# Patient Record
Sex: Male | Born: 1965 | Race: White | Hispanic: No | Marital: Married | Smoking: Former smoker
Health system: Southern US, Community
[De-identification: ages and names within clinical notes are randomized; demographics above are authoritative.]

## PROBLEM LIST (undated history)

## (undated) DIAGNOSIS — E785 Hyperlipidemia, unspecified: Secondary | ICD-10-CM

## (undated) DIAGNOSIS — M51369 Other intervertebral disc degeneration, lumbar region without mention of lumbar back pain or lower extremity pain: Secondary | ICD-10-CM

## (undated) DIAGNOSIS — I10 Essential (primary) hypertension: Secondary | ICD-10-CM

## (undated) DIAGNOSIS — N390 Urinary tract infection, site not specified: Secondary | ICD-10-CM

## (undated) DIAGNOSIS — C801 Malignant (primary) neoplasm, unspecified: Secondary | ICD-10-CM

## (undated) DIAGNOSIS — G4730 Sleep apnea, unspecified: Secondary | ICD-10-CM

## (undated) DIAGNOSIS — E669 Obesity, unspecified: Secondary | ICD-10-CM

## (undated) DIAGNOSIS — M5136 Other intervertebral disc degeneration, lumbar region: Secondary | ICD-10-CM

## (undated) HISTORY — PX: NOSE SURGERY: SHX723

## (undated) HISTORY — DX: Sleep apnea, unspecified: G47.30

## (undated) HISTORY — PX: JOINT REPLACEMENT: SHX530

## (undated) HISTORY — PX: KNEE ARTHROSCOPY W/ MENISCAL REPAIR: SHX1877

## (undated) HISTORY — PX: WISDOM TOOTH EXTRACTION: SHX21

## (undated) HISTORY — DX: Malignant (primary) neoplasm, unspecified: C80.1

## (undated) HISTORY — PX: TONSILLECTOMY: SUR1361

## (undated) HISTORY — PX: FRACTURE SURGERY: SHX138

---

## 2005-01-04 ENCOUNTER — Emergency Department (HOSPITAL_COMMUNITY): Admission: EM | Admit: 2005-01-04 | Discharge: 2005-01-04 | Payer: Self-pay | Admitting: Family Medicine

## 2005-01-24 ENCOUNTER — Emergency Department (HOSPITAL_COMMUNITY): Admission: EM | Admit: 2005-01-24 | Discharge: 2005-01-24 | Payer: Self-pay | Admitting: Emergency Medicine

## 2005-07-06 ENCOUNTER — Emergency Department (HOSPITAL_COMMUNITY): Admission: EM | Admit: 2005-07-06 | Discharge: 2005-07-06 | Payer: Self-pay | Admitting: Family Medicine

## 2008-10-18 ENCOUNTER — Emergency Department (HOSPITAL_COMMUNITY): Admission: EM | Admit: 2008-10-18 | Discharge: 2008-10-18 | Payer: Self-pay | Admitting: Family Medicine

## 2009-05-27 ENCOUNTER — Emergency Department (HOSPITAL_COMMUNITY)
Admission: EM | Admit: 2009-05-27 | Discharge: 2009-05-27 | Payer: Self-pay | Source: Home / Self Care | Admitting: Family Medicine

## 2010-01-11 ENCOUNTER — Emergency Department (HOSPITAL_COMMUNITY)
Admission: EM | Admit: 2010-01-11 | Discharge: 2010-01-11 | Payer: Self-pay | Source: Home / Self Care | Admitting: Emergency Medicine

## 2010-01-21 LAB — POCT I-STAT, CHEM 8
BUN: 16 mg/dL (ref 6–23)
Calcium, Ion: 1.11 mmol/L — ABNORMAL LOW (ref 1.12–1.32)
Chloride: 107 mEq/L (ref 96–112)
Creatinine, Ser: 0.9 mg/dL (ref 0.4–1.5)
Glucose, Bld: 101 mg/dL — ABNORMAL HIGH (ref 70–99)
HCT: 47 % (ref 39.0–52.0)
Hemoglobin: 16 g/dL (ref 13.0–17.0)
Potassium: 3.5 mEq/L (ref 3.5–5.1)
Sodium: 140 mEq/L (ref 135–145)
TCO2: 27 mmol/L (ref 0–100)

## 2010-02-20 ENCOUNTER — Emergency Department (HOSPITAL_COMMUNITY): Payer: Self-pay

## 2010-02-20 ENCOUNTER — Emergency Department (HOSPITAL_COMMUNITY)
Admission: EM | Admit: 2010-02-20 | Discharge: 2010-02-20 | Disposition: A | Discharge status: Home / Self Care | Payer: Self-pay | Attending: Emergency Medicine | Admitting: Emergency Medicine

## 2010-02-20 DIAGNOSIS — I10 Essential (primary) hypertension: Secondary | ICD-10-CM | POA: Insufficient documentation

## 2010-02-20 DIAGNOSIS — M79609 Pain in unspecified limb: Secondary | ICD-10-CM | POA: Insufficient documentation

## 2010-02-20 DIAGNOSIS — IMO0002 Reserved for concepts with insufficient information to code with codable children: Secondary | ICD-10-CM | POA: Insufficient documentation

## 2010-02-20 DIAGNOSIS — W11XXXA Fall on and from ladder, initial encounter: Secondary | ICD-10-CM | POA: Insufficient documentation

## 2011-04-20 ENCOUNTER — Encounter (HOSPITAL_COMMUNITY): Payer: Self-pay | Admitting: Certified Registered Nurse Anesthetist

## 2011-04-20 ENCOUNTER — Emergency Department (HOSPITAL_COMMUNITY): Payer: PRIVATE HEALTH INSURANCE

## 2011-04-20 ENCOUNTER — Inpatient Hospital Stay (HOSPITAL_COMMUNITY)
Admission: EM | Admit: 2011-04-20 | Discharge: 2011-04-25 | DRG: 339 | Disposition: A | Discharge status: Home / Self Care | Payer: PRIVATE HEALTH INSURANCE | Source: Ambulatory Visit | Attending: General Surgery | Admitting: General Surgery

## 2011-04-20 ENCOUNTER — Encounter (HOSPITAL_COMMUNITY): Payer: Self-pay

## 2011-04-20 ENCOUNTER — Emergency Department (HOSPITAL_COMMUNITY): Payer: PRIVATE HEALTH INSURANCE | Admitting: Certified Registered Nurse Anesthetist

## 2011-04-20 ENCOUNTER — Encounter (HOSPITAL_COMMUNITY): Admission: EM | Disposition: A | Discharge status: Home / Self Care | Payer: Self-pay | Source: Ambulatory Visit

## 2011-04-20 DIAGNOSIS — F17200 Nicotine dependence, unspecified, uncomplicated: Secondary | ICD-10-CM | POA: Diagnosis present

## 2011-04-20 DIAGNOSIS — Z6835 Body mass index (BMI) 35.0-35.9, adult: Secondary | ICD-10-CM

## 2011-04-20 DIAGNOSIS — I10 Essential (primary) hypertension: Secondary | ICD-10-CM | POA: Diagnosis present

## 2011-04-20 DIAGNOSIS — Z7982 Long term (current) use of aspirin: Secondary | ICD-10-CM

## 2011-04-20 DIAGNOSIS — E669 Obesity, unspecified: Secondary | ICD-10-CM | POA: Diagnosis present

## 2011-04-20 DIAGNOSIS — K35209 Acute appendicitis with generalized peritonitis, without abscess, unspecified as to perforation: Principal | ICD-10-CM | POA: Diagnosis present

## 2011-04-20 DIAGNOSIS — K3532 Acute appendicitis with perforation and localized peritonitis, without abscess: Secondary | ICD-10-CM

## 2011-04-20 DIAGNOSIS — K560 Paralytic ileus: Secondary | ICD-10-CM | POA: Diagnosis not present

## 2011-04-20 DIAGNOSIS — K352 Acute appendicitis with generalized peritonitis, without abscess: Principal | ICD-10-CM | POA: Diagnosis present

## 2011-04-20 DIAGNOSIS — K3580 Unspecified acute appendicitis: Secondary | ICD-10-CM

## 2011-04-20 DIAGNOSIS — Z5331 Laparoscopic surgical procedure converted to open procedure: Secondary | ICD-10-CM

## 2011-04-20 HISTORY — DX: Essential (primary) hypertension: I10

## 2011-04-20 HISTORY — PX: APPENDECTOMY: SHX54

## 2011-04-20 LAB — CBC
HCT: 42.5 % (ref 39.0–52.0)
Hemoglobin: 15 g/dL (ref 13.0–17.0)
MCH: 30.3 pg (ref 26.0–34.0)
MCHC: 35.3 g/dL (ref 30.0–36.0)
MCV: 85.9 fL (ref 78.0–100.0)
Platelets: 243 10*3/uL (ref 150–400)
RBC: 4.95 MIL/uL (ref 4.22–5.81)
RDW: 12.9 % (ref 11.5–15.5)
WBC: 15.7 10*3/uL — ABNORMAL HIGH (ref 4.0–10.5)

## 2011-04-20 LAB — DIFFERENTIAL
Basophils Absolute: 0 10*3/uL (ref 0.0–0.1)
Basophils Relative: 0 % (ref 0–1)
Eosinophils Absolute: 0 10*3/uL (ref 0.0–0.7)
Eosinophils Relative: 0 % (ref 0–5)
Lymphocytes Relative: 4 % — ABNORMAL LOW (ref 12–46)
Lymphs Abs: 0.6 10*3/uL — ABNORMAL LOW (ref 0.7–4.0)
Monocytes Absolute: 1.5 10*3/uL — ABNORMAL HIGH (ref 0.1–1.0)
Monocytes Relative: 9 % (ref 3–12)
Neutro Abs: 13.6 10*3/uL — ABNORMAL HIGH (ref 1.7–7.7)
Neutrophils Relative %: 86 % — ABNORMAL HIGH (ref 43–77)

## 2011-04-20 LAB — COMPREHENSIVE METABOLIC PANEL
ALT: 32 U/L (ref 0–53)
AST: 28 U/L (ref 0–37)
Albumin: 3.9 g/dL (ref 3.5–5.2)
Alkaline Phosphatase: 67 U/L (ref 39–117)
BUN: 14 mg/dL (ref 6–23)
CO2: 27 mEq/L (ref 19–32)
Calcium: 9.7 mg/dL (ref 8.4–10.5)
Chloride: 103 mEq/L (ref 96–112)
Creatinine, Ser: 0.88 mg/dL (ref 0.50–1.35)
GFR calc Af Amer: >90 mL/min (ref >90)
GFR calc non Af Amer: >90 mL/min (ref >90)
Glucose, Bld: 111 mg/dL — ABNORMAL HIGH (ref 70–99)
Potassium: 4 mEq/L (ref 3.5–5.1)
Sodium: 141 mEq/L (ref 135–145)
Total Bilirubin: 1 mg/dL (ref 0.3–1.2)
Total Protein: 7.3 g/dL (ref 6.0–8.3)

## 2011-04-20 LAB — URINALYSIS, ROUTINE W REFLEX MICROSCOPIC
Bilirubin Urine: NEGATIVE
Glucose, UA: NEGATIVE mg/dL
Ketones, ur: >80 mg/dL — AB
Nitrite: NEGATIVE
Protein, ur: NEGATIVE mg/dL
Specific Gravity, Urine: 1.027 (ref 1.005–1.030)
Urobilinogen, UA: 1 mg/dL (ref 0.0–1.0)
pH: 6 (ref 5.0–8.0)

## 2011-04-20 LAB — URINE MICROSCOPIC-ADD ON

## 2011-04-20 LAB — LIPASE, BLOOD: Lipase: 22 U/L (ref 11–59)

## 2011-04-20 SURGERY — APPENDECTOMY, LAPAROSCOPIC
Anesthesia: General | Site: Abdomen | Wound class: Dirty or Infected

## 2011-04-20 MED ORDER — IOHEXOL 300 MG/ML  SOLN
20.0000 mL | INTRAMUSCULAR | Status: AC
  Administered 2011-04-20 (×2): 20 mL via ORAL

## 2011-04-20 MED ORDER — SODIUM CHLORIDE 0.9 % IV SOLN
1.0000 g | Freq: Once | INTRAVENOUS | Status: AC
  Administered 2011-04-20: 1 g via INTRAVENOUS
  Filled 2011-04-20: qty 1

## 2011-04-20 MED ORDER — PROPOFOL 10 MG/ML IV EMUL
INTRAVENOUS | Status: DC | PRN
  Administered 2011-04-20: 300 mg via INTRAVENOUS

## 2011-04-20 MED ORDER — IOHEXOL 300 MG/ML  SOLN
100.0000 mL | Freq: Once | INTRAMUSCULAR | Status: AC | PRN
  Administered 2011-04-20: 100 mL via INTRAVENOUS

## 2011-04-20 MED ORDER — ONDANSETRON HCL 4 MG/2ML IJ SOLN: 4.0000 mg | Freq: Four times a day (QID) | INTRAMUSCULAR | Status: DC | PRN

## 2011-04-20 MED ORDER — MIDAZOLAM HCL 5 MG/5ML IJ SOLN
INTRAMUSCULAR | Status: DC | PRN
  Administered 2011-04-20: 2 mg via INTRAVENOUS

## 2011-04-20 MED ORDER — SODIUM CHLORIDE 0.9 % IV SOLN
INTRAVENOUS | Status: DC
  Administered 2011-04-21 – 2011-04-23 (×6): via INTRAVENOUS

## 2011-04-20 MED ORDER — METOPROLOL TARTRATE 1 MG/ML IV SOLN
INTRAVENOUS | Status: DC | PRN
  Administered 2011-04-20 (×5): 1 mg via INTRAVENOUS

## 2011-04-20 MED ORDER — PIPERACILLIN-TAZOBACTAM 3.375 G IVPB: 3.3750 g | Freq: Once | INTRAVENOUS | Status: DC

## 2011-04-20 MED ORDER — HYDROMORPHONE 0.3 MG/ML IV SOLN
INTRAVENOUS | Status: DC
  Administered 2011-04-20: 22:00:00 via INTRAVENOUS
  Administered 2011-04-21: 3 mg via INTRAVENOUS
  Administered 2011-04-21: 16:00:00 via INTRAVENOUS
  Administered 2011-04-21: 4.08 mg via INTRAVENOUS
  Administered 2011-04-21: 4.8 mg via INTRAVENOUS
  Administered 2011-04-21: 2.4 mg via INTRAVENOUS
  Administered 2011-04-21: 2.7 mg via INTRAVENOUS
  Administered 2011-04-21: 07:00:00 via INTRAVENOUS
  Administered 2011-04-21: 2.1 mg via INTRAVENOUS
  Administered 2011-04-22: 5.1 mg via INTRAVENOUS
  Administered 2011-04-22: 0.6 mg via INTRAVENOUS
  Administered 2011-04-22: 1.5 mg via INTRAVENOUS
  Administered 2011-04-22: 4.8 mg via INTRAVENOUS
  Administered 2011-04-22: 1.8 mg via INTRAVENOUS
  Administered 2011-04-22: 1.5 mg via INTRAVENOUS
  Administered 2011-04-23: 0.9 mg via INTRAVENOUS
  Administered 2011-04-23: 1.5 mg via INTRAVENOUS
  Administered 2011-04-23: 0.6 mg via INTRAVENOUS
  Administered 2011-04-23: 10:00:00 via INTRAVENOUS
  Administered 2011-04-23: 0.9 mg via INTRAVENOUS
  Administered 2011-04-23: 5 mL via INTRAVENOUS
  Administered 2011-04-23: 8 mL via INTRAVENOUS
  Administered 2011-04-24: 0.3 mg via INTRAVENOUS
  Administered 2011-04-24: 0.9 mg via INTRAVENOUS

## 2011-04-20 MED ORDER — GLYCOPYRROLATE 0.2 MG/ML IJ SOLN
INTRAMUSCULAR | Status: DC | PRN
  Administered 2011-04-20: .6 mg via INTRAVENOUS

## 2011-04-20 MED ORDER — SUCCINYLCHOLINE CHLORIDE 20 MG/ML IJ SOLN
INTRAMUSCULAR | Status: DC | PRN
  Administered 2011-04-20: 200 mg via INTRAVENOUS

## 2011-04-20 MED ORDER — HYDROMORPHONE 0.3 MG/ML IV SOLN
INTRAVENOUS | Status: AC
  Filled 2011-04-20: qty 25

## 2011-04-20 MED ORDER — HYDROMORPHONE HCL PF 1 MG/ML IJ SOLN
0.2500 mg | INTRAMUSCULAR | Status: DC | PRN
  Administered 2011-04-20 (×4): 0.5 mg via INTRAVENOUS

## 2011-04-20 MED ORDER — DEXAMETHASONE SODIUM PHOSPHATE 4 MG/ML IJ SOLN
INTRAMUSCULAR | Status: DC | PRN
  Administered 2011-04-20: 8 mg via INTRAVENOUS

## 2011-04-20 MED ORDER — MORPHINE SULFATE 4 MG/ML IJ SOLN
4.0000 mg | Freq: Once | INTRAMUSCULAR | Status: AC
  Administered 2011-04-20: 4 mg via INTRAVENOUS
  Filled 2011-04-20: qty 1

## 2011-04-20 MED ORDER — LIDOCAINE HCL (CARDIAC) 20 MG/ML IV SOLN
INTRAVENOUS | Status: DC | PRN
  Administered 2011-04-20: 100 mg via INTRAVENOUS

## 2011-04-20 MED ORDER — NEOSTIGMINE METHYLSULFATE 1 MG/ML IJ SOLN
INTRAMUSCULAR | Status: DC | PRN
  Administered 2011-04-20: 5 mg via INTRAVENOUS

## 2011-04-20 MED ORDER — ACETAMINOPHEN 325 MG PO TABS: 650.0000 mg | ORAL_TABLET | Freq: Four times a day (QID) | ORAL | Status: DC | PRN

## 2011-04-20 MED ORDER — ONDANSETRON HCL 4 MG/2ML IJ SOLN
4.0000 mg | Freq: Four times a day (QID) | INTRAMUSCULAR | Status: DC | PRN
  Filled 2011-04-20: qty 2

## 2011-04-20 MED ORDER — ACETAMINOPHEN 650 MG RE SUPP: 650.0000 mg | Freq: Four times a day (QID) | RECTAL | Status: DC | PRN

## 2011-04-20 MED ORDER — PANTOPRAZOLE SODIUM 40 MG IV SOLR
40.0000 mg | Freq: Every day | INTRAVENOUS | Status: DC
  Administered 2011-04-21 – 2011-04-23 (×3): 40 mg via INTRAVENOUS
  Filled 2011-04-20 (×4): qty 40

## 2011-04-20 MED ORDER — SODIUM CHLORIDE 0.9 % IR SOLN
Status: DC | PRN
  Administered 2011-04-20: 3000 mL
  Administered 2011-04-20: 1000 mL

## 2011-04-20 MED ORDER — SODIUM CHLORIDE 0.9 % IV SOLN
INTRAVENOUS | Status: DC
  Administered 2011-04-20 (×2): 125 mL/h via INTRAVENOUS

## 2011-04-20 MED ORDER — LACTATED RINGERS IV SOLN
INTRAVENOUS | Status: DC | PRN
  Administered 2011-04-20 (×2): via INTRAVENOUS

## 2011-04-20 MED ORDER — NEBIVOLOL HCL 10 MG PO TABS
10.0000 mg | ORAL_TABLET | Freq: Every day | ORAL | Status: DC
  Filled 2011-04-20: qty 1

## 2011-04-20 MED ORDER — ONDANSETRON HCL 4 MG/2ML IJ SOLN
INTRAMUSCULAR | Status: DC | PRN
  Administered 2011-04-20: 4 mg via INTRAVENOUS

## 2011-04-20 MED ORDER — DIPHENHYDRAMINE HCL 50 MG/ML IJ SOLN: 12.5000 mg | Freq: Four times a day (QID) | INTRAMUSCULAR | Status: DC | PRN

## 2011-04-20 MED ORDER — ONDANSETRON HCL 4 MG/2ML IJ SOLN
4.0000 mg | Freq: Once | INTRAMUSCULAR | Status: AC
  Administered 2011-04-20: 4 mg via INTRAVENOUS
  Filled 2011-04-20: qty 2

## 2011-04-20 MED ORDER — DIPHENHYDRAMINE HCL 12.5 MG/5ML PO ELIX
12.5000 mg | ORAL_SOLUTION | Freq: Four times a day (QID) | ORAL | Status: DC | PRN
  Filled 2011-04-20: qty 5

## 2011-04-20 MED ORDER — FENTANYL CITRATE 0.05 MG/ML IJ SOLN
INTRAMUSCULAR | Status: DC | PRN
  Administered 2011-04-20 (×7): 100 ug via INTRAVENOUS

## 2011-04-20 MED ORDER — NALOXONE HCL 0.4 MG/ML IJ SOLN: 0.4000 mg | INTRAMUSCULAR | Status: DC | PRN

## 2011-04-20 MED ORDER — SODIUM CHLORIDE 0.9 % IV SOLN
1.0000 g | INTRAVENOUS | Status: DC
  Administered 2011-04-21 – 2011-04-23 (×3): 1 g via INTRAVENOUS
  Filled 2011-04-20 (×4): qty 1

## 2011-04-20 MED ORDER — ONDANSETRON HCL 4 MG/2ML IJ SOLN: 4.0000 mg | Freq: Once | INTRAMUSCULAR | Status: DC | PRN

## 2011-04-20 MED ORDER — SODIUM CHLORIDE 0.9 % IJ SOLN: 9.0000 mL | INTRAMUSCULAR | Status: DC | PRN

## 2011-04-20 MED ORDER — VECURONIUM BROMIDE 10 MG IV SOLR
INTRAVENOUS | Status: DC | PRN
  Administered 2011-04-20: 4 mg via INTRAVENOUS
  Administered 2011-04-20: 6 mg via INTRAVENOUS
  Administered 2011-04-20: 4 mg via INTRAVENOUS

## 2011-04-20 MED ORDER — DROPERIDOL 2.5 MG/ML IJ SOLN
INTRAMUSCULAR | Status: DC | PRN
  Administered 2011-04-20: 0.625 mg via INTRAVENOUS

## 2011-04-20 MED ORDER — METOCLOPRAMIDE HCL 5 MG/ML IJ SOLN
INTRAMUSCULAR | Status: DC | PRN
  Administered 2011-04-20: 10 mg via INTRAVENOUS

## 2011-04-20 MED ORDER — HYDROMORPHONE HCL PF 1 MG/ML IJ SOLN
INTRAMUSCULAR | Status: AC
  Filled 2011-04-20: qty 1

## 2011-04-20 SURGICAL SUPPLY — 55 items
ADH SKN CLS APL DERMABOND .7 (GAUZE/BANDAGES/DRESSINGS) ×1
APPLIER CLIP ROT 10 11.4 M/L (STAPLE)
APR CLP MED LRG 11.4X10 (STAPLE)
BAG SPEC RTRVL LRG 6X4 10 (ENDOMECHANICALS) ×1
CANISTER SUCTION 2500CC (MISCELLANEOUS) IMPLANT
CANISTER WOUND CARE 500ML ATS (WOUND CARE) ×1 IMPLANT
CHLORAPREP W/TINT 26ML (MISCELLANEOUS) ×2 IMPLANT
CLIP APPLIE ROT 10 11.4 M/L (STAPLE) IMPLANT
CLOTH BEACON ORANGE TIMEOUT ST (SAFETY) ×2 IMPLANT
COVER SURGICAL LIGHT HANDLE (MISCELLANEOUS) ×2 IMPLANT
CUTTER FLEX LINEAR 45M (STAPLE) ×2 IMPLANT
DERMABOND ADVANCED (GAUZE/BANDAGES/DRESSINGS) ×1
DERMABOND ADVANCED .7 DNX12 (GAUZE/BANDAGES/DRESSINGS) ×1 IMPLANT
DRAIN CHANNEL 19F RND (DRAIN) ×1 IMPLANT
DRSG VAC ATS LRG SENSATRAC (GAUZE/BANDAGES/DRESSINGS) ×2 IMPLANT
DURAPREP 26ML APPLICATOR (WOUND CARE) ×1 IMPLANT
ELECT REM PT RETURN 9FT ADLT (ELECTROSURGICAL) ×2
ELECTRODE REM PT RTRN 9FT ADLT (ELECTROSURGICAL) ×1 IMPLANT
EVACUATOR SILICONE 100CC (DRAIN) ×1 IMPLANT
GAUZE SPONGE 2X2 8PLY STRL LF (GAUZE/BANDAGES/DRESSINGS) IMPLANT
GLOVE BIO SURGEON STRL SZ7 (GLOVE) ×2 IMPLANT
GLOVE BIOGEL PI IND STRL 7.5 (GLOVE) ×1 IMPLANT
GLOVE BIOGEL PI INDICATOR 7.5 (GLOVE) ×1
GOWN STRL NON-REIN LRG LVL3 (GOWN DISPOSABLE) ×6 IMPLANT
KIT BASIN OR (CUSTOM PROCEDURE TRAY) ×2 IMPLANT
KIT ROOM TURNOVER OR (KITS) ×2 IMPLANT
NS IRRIG 1000ML POUR BTL (IV SOLUTION) ×2 IMPLANT
PAD ARMBOARD 7.5X6 YLW CONV (MISCELLANEOUS) ×4 IMPLANT
POUCH SPECIMEN RETRIEVAL 10MM (ENDOMECHANICALS) ×2 IMPLANT
RELOAD 45 VASCULAR/THIN (ENDOMECHANICALS) IMPLANT
RELOAD STAPLE 45 2.5 WHT GRN (ENDOMECHANICALS) ×1 IMPLANT
RELOAD STAPLE 45 3.5 BLU ETS (ENDOMECHANICALS) IMPLANT
RELOAD STAPLE TA45 3.5 REG BLU (ENDOMECHANICALS) ×4 IMPLANT
SCALPEL HARMONIC ACE (MISCELLANEOUS) ×2 IMPLANT
SCISSORS LAP 5X35 DISP (ENDOMECHANICALS) IMPLANT
SET IRRIG TUBING LAPAROSCOPIC (IRRIGATION / IRRIGATOR) ×2 IMPLANT
SLEEVE ENDOPATH XCEL 5M (ENDOMECHANICALS) ×2 IMPLANT
SPECIMEN JAR MEDIUM (MISCELLANEOUS) ×1 IMPLANT
SPONGE GAUZE 2X2 STER 10/PKG (GAUZE/BANDAGES/DRESSINGS) ×1
STAPLER VISISTAT 35W (STAPLE) ×1 IMPLANT
SUCTION POOLE TIP (SUCTIONS) ×1 IMPLANT
SUT ETHILON 2 0 FS 18 (SUTURE) ×1 IMPLANT
SUT MNCRL AB 4-0 PS2 18 (SUTURE) ×2 IMPLANT
SUT NOVA T20/GS 25 (SUTURE) ×1 IMPLANT
SUT PDS AB 1 TP1 96 (SUTURE) ×2 IMPLANT
SUT SILK 2 0 SH CR/8 (SUTURE) ×1 IMPLANT
SUT SILK 3 0 SH CR/8 (SUTURE) ×1 IMPLANT
SYR BULB IRRIGATION 50ML (SYRINGE) ×1 IMPLANT
TOWEL OR 17X24 6PK STRL BLUE (TOWEL DISPOSABLE) ×2 IMPLANT
TOWEL OR 17X26 10 PK STRL BLUE (TOWEL DISPOSABLE) ×2 IMPLANT
TRAY FOLEY CATH 14FR (SET/KITS/TRAYS/PACK) ×2 IMPLANT
TRAY LAPAROSCOPIC (CUSTOM PROCEDURE TRAY) ×2 IMPLANT
TROCAR XCEL BLUNT TIP 100MML (ENDOMECHANICALS) ×2 IMPLANT
TROCAR XCEL NON-BLD 5MMX100MML (ENDOMECHANICALS) ×2 IMPLANT
YANKAUER SUCT BULB TIP NO VENT (SUCTIONS) ×1 IMPLANT

## 2011-04-20 NOTE — ED Provider Notes (Signed)
History     CSN: [id number]  Arrival date & time 04/20/11  0756   First MD Initiated Contact with Patient 04/20/11 [phone]      Chief Complaint  Patient presents with  . Shortness of Breath    (Consider location/radiation/quality/duration/timing/severity/associated sxs/prior treatment) HPI  Past Medical History  Diagnosis Date  . Hypertension     Past Surgical History  Procedure Date  . Tonsillectomy   . Fracture surgery     No family history on file.  History  Substance Use Topics  . Smoking status: Current Some Day Smoker    Types: Cigars  . Smokeless tobacco: Not on file  . Alcohol Use: No      Review of Systems  Allergies  Review of patient's allergies indicates no known allergies.  Home Medications   Current Outpatient Rx  Name Route Sig Dispense Refill  . ASPIRIN 81 MG PO CHEW Oral Chew 243 mg by mouth daily.    . CYCLOBENZAPRINE HCL 10 MG PO TABS Oral Take 10 mg by mouth 2 (two) times daily as needed. For back pain    . HYDROCHLOROTHIAZIDE 25 MG PO TABS Oral Take 25 mg by mouth daily.    . ADULT MULTIVITAMIN W/MINERALS CH Oral Take 1 tablet by mouth daily.    . NEBIVOLOL HCL 10 MG PO TABS Oral Take 10 mg by mouth daily.    . OMEGA 3 1200 MG PO CAPS Oral Take 1 capsule by mouth daily.    . TRAMADOL HCL 50 MG PO TABS Oral Take 50 mg by mouth 2 (two) times daily as needed. For pain      BP 162/57  Pulse 95  Temp(Src) 99.1 F (37.3 C) (Oral)  Resp 20  SpO2 95%  Physical Exam  ED Course  Procedures (including critical care time)  Labs Reviewed  COMPREHENSIVE METABOLIC PANEL - Abnormal; Notable for the following:    Glucose, Bld 111 (*)    All other components within normal limits  CBC - Abnormal; Notable for the following:    WBC 15.7 (*)    All other components within normal limits  DIFFERENTIAL - Abnormal; Notable for the following:    Neutrophils Relative 86 (*)    Neutro Abs 13.6 (*)    Lymphocytes Relative 4 (*)    Lymphs Abs 0.6  (*)    Monocytes Absolute 1.5 (*)    All other components within normal limits  LIPASE, BLOOD  URINALYSIS, ROUTINE W REFLEX MICROSCOPIC   US Abdomen Complete  04/20/2011  *RADIOLOGY REPORT*  Clinical Data:  46 year old male with right-sided abdominal pain.  ABDOMINAL ULTRASOUND COMPLETE  Comparison:  None  Findings:  Gallbladder: At least two small mobile gallstones are noted, the largest measuring 5 mm.  There is no evidence of gallbladder wall thickening, pericholecystic fluid or sonographic Murphy's sign.  Common Bile Duct:  There is no evidence of intrahepatic or extrahepatic biliary dilation. The CBD measures 4.5 mm in greatest diameter.  Liver:  The liver is within normal limits in parenchymal echogenicity. No focal abnormalities are identified.  IVC:  Appears normal.  Pancreas:  Although the pancreas is difficult to visualize in its entirety, no focal pancreatic abnormality is identified.  Spleen:  Within normal limits in size and echotexture.  Right kidney:  The right kidney is normal in size and parenchymal echogenicity.  There is no evidence of solid mass, hydronephrosis or definite renal calculi.  The right kidney measures 12.7 cm.  Left kidney:  The  left kidney is normal in size and parenchymal echogenicity.  There is no evidence of solid mass, hydronephrosis or definite renal calculi.   The left kidney measures 12.2 cm.  Abdominal Aorta:  No abdominal aortic aneurysm identified.  There is no evidence of ascites.  IMPRESSION: Cholelithiasis without evidence of acute cholecystitis.  No other significant abnormalities identified.  Original Report Authenticated By: Rosendo Gros, M.D.     1. Ruptured appendicitis     11:40 AM Patient seen and examined. Patient to CDU pending surgery consult for gallstones, right upper quadrant pain.  Vital signs reviewed and are as follows: Filed Vitals:   04/20/11 1124  BP: 162/57  Pulse: 95  Temp:   Resp: 20   Surgeon has seen patient and has  ordered a CT scan. Patient to be admitted to the hospital if CT scan is negative for cholecystectomy. Additional pain medication ordered.  Exam:  Gen NAD; Heart RRR, nml S1,S2, no m/r/g; Lungs CTAB; Abd soft, RUQ tenderness, no rebound, involuntary guarding; Ext 2+ pedal pulses bilaterally, no edema.  4:40PM CT showed ruptured appendicitis. Reviewed by myself. Dr. Dwain Sarna aware. Invanz ordered. Patient informed. He is comfortable.   BP 100/65  Pulse 93  Temp(Src) 102.6 F (39.2 C) (Oral)  Resp 18  SpO2 95%   MDM  Admit        Renne Crigler, PA 04/20/11 1710

## 2011-04-20 NOTE — ED Notes (Signed)
Patient transported to CT 

## 2011-04-20 NOTE — Progress Notes (Signed)
Wound VAC wound intact and sealed per protocol.

## 2011-04-20 NOTE — Anesthesia Preprocedure Evaluation (Addendum)
Anesthesia Evaluation  Patient identified by MRN, date of birth, ID band Patient awake    Reviewed: Allergy & Precautions, H&P , NPO status , Patient's Chart, lab work & pertinent test results, reviewed documented beta blocker date and time   Airway Mallampati: II TM Distance: >3 FB Neck ROM: Full    Dental  (+) Teeth Intact and Dental Advisory Given   Pulmonary  breath sounds clear to auscultation        Cardiovascular Rhythm:Regular Rate:Normal     Neuro/Psych    GI/Hepatic   Endo/Other  Morbid obesity  Renal/GU      Musculoskeletal   Abdominal   Peds  Hematology   Anesthesia Other Findings   Reproductive/Obstetrics                           Anesthesia Physical Anesthesia Plan  ASA: III and Emergent  Anesthesia Plan: General   Post-op Pain Management:    Induction: Intravenous and Rapid sequence  Airway Management Planned: Oral ETT  Additional Equipment:   Intra-op Plan:   Post-operative Plan: Extubation in OR  Informed Consent: I have reviewed the patients History and Physical, chart, labs and discussed the procedure including the risks, benefits and alternatives for the proposed anesthesia with the patient or authorized representative who has indicated his/her understanding and acceptance.   Dental advisory given  Plan Discussed with: CRNA, Anesthesiologist and Surgeon  Anesthesia Plan Comments:         Anesthesia Quick Evaluation

## 2011-04-20 NOTE — Preoperative (Addendum)
Beta Blockers   Reason not to administer Beta Blockers:pt took Bystolic 15 mg po@0600hr  on 04/19/2011 (RF).Order per Dr. Ivin Booty to adm 5mg  Metoprolol IV intra-op.

## 2011-04-20 NOTE — Anesthesia Postprocedure Evaluation (Signed)
  Anesthesia Post-op Note  Patient: Scott York  Procedure(s) Performed: Procedure(s) (LRB): APPENDECTOMY LAPAROSCOPIC (N/A) APPENDECTOMY (N/A)  Patient Location: PACU  Anesthesia Type: General  Level of Consciousness: awake, alert  and oriented  Airway and Oxygen Therapy: Patient Spontanous Breathing and Patient connected to nasal cannula oxygen  Post-op Pain: mild  Post-op Assessment: Post-op Vital signs reviewed, Patient's Cardiovascular Status Stable, Respiratory Function Stable, Patent Airway, No signs of Nausea or vomiting and Pain level controlled  Post-op Vital Signs: Reviewed and stable  Complications: No apparent anesthesia complications

## 2011-04-20 NOTE — Op Note (Signed)
Preoperative diagnosis: Acute appendicitis Postoperative diagnosis: Acute gangrenous appendicitis with local abscess Procedure: #1 attempted laparoscopic appendectomy #2 celiotomy with open appendectomy, and drain placement #3 vac placement over wound Surgeon: Dr. Harden Mo Anesthesia: Gen. Endotracheal Estimated blood loss: 50 cc Specimens: Appendix and surrounding tissue to pathology Sponge and found correct x2 that operation Disposition to recovery in stable condition  Indications: This is a 46 year old male who had right-sided abdominal pain, elevated white blood cell count.He underwent a right upper quadrant ultrasound that showed cholelithiasis and I was consulted by the emergency room to discuss cholecystectomy. On my exam I was concerned that his ultrasound did not really correlate with this. I obtained a CT scan which showed appendicitis that appeared to have ruptured without a definable abscess. We discussed options and decided proceed to the operating room. The risks and benefits of appendectomy were discussed prior to beginning.  Procedure: After informed consent was obtained the patient was taken to the operating room. He was given 1 g of intravenous Invanz. Sequential compression devices were placed on his legs prior to beginning. He then underwent general endotracheal anesthesia without complication. His abdomen was prepped and draped in the standard sterile surgical fashion. Surgical timeout was performed.  I then infiltrated quarter percent Marcaine in his left upper quadrant. I made an incision in it and inserted a 5 mm Optiview trocar without any difficulty. I inspected this area and there is no evidence of an entry injury. I then inserted 3 further 5 mm trocars in the lower abdomen and one at the umbilicus. His cecum was noted to be in his right upper quadrant as expected from the CT scan. I then pushed the omentum back. There was an abscess cavity right around his cecum.  His terminal ileum was identified and not injured throughout the procedure. I was able to roll his colon medially after I took down the white line of Toldt with the harmonic scalpel. Then I used blunt dissection and harmonic scalpel dissection to attempt to try and identify the appendix and separated it  from the cecum. After about an hour was unable to do this successfully and I was concerned about causing an injury. I was unable laparoscopically to define his appendix and cecum. I decided at that time to stop laparoscopy and proceeded to an open operation. I then did an upper midline due to the position of his appendix. I inserted the Bookwalter retractor. This was difficult opened even identify any of the structures around his cecum. Eventually I got into a pocket and drained murky fluid as well as debridement of necrotic tissue. His appendix for the most part was not really definable in this area. I eventually was able to identify its base. He actually had a complete disruption of his appendix about a centimeter half off of a clean base and the remainder of the appendix was gangrenous and not really identifiable. The remainder of the cecum was soft and pink. I did use a stapler to staple across the appendiceal base as well as a small portion of the cecum. I then oversewed this with 3-0 silk suture. The appendiceal artery was identified and tied off with a 3-0 silk suture. Once I completed this I could not identify any more portions of appendix. It appeared I drained all the infection at this point. I irrigated this copiously. I then inspected the cecum and the terminal ileum again and they both appeared healthy. I then placed a 77 [place] Blake drain  in the right lower quadrant. I had previously removed all my trocars. I then closed the incision with #1 loop PDS. I irrigated the incision and placed a vac sponge overlying it. This was functional upon completion. However this was extubated and transferred recovery  room in stable condition.

## 2011-04-20 NOTE — Anesthesia Procedure Notes (Signed)
Procedure Name: Intubation Date/Time: 04/20/2011 6:08 PM Performed by: Wray Kearns A Pre-anesthesia Checklist: Patient identified, Timeout performed, Emergency Drugs available, Suction available and Patient being monitored Patient Re-evaluated:Patient Re-evaluated prior to inductionOxygen Delivery Method: Circle system utilized Preoxygenation: Pre-oxygenation with 100% oxygen Intubation Type: IV induction, Rapid sequence and Cricoid Pressure applied Ventilation: Mask ventilation without difficulty Laryngoscope Size: Mac and 4 Grade View: Grade I Tube type: Oral Tube size: 8.0 mm Number of attempts: 1 Airway Equipment and Method: Stylet Placement Confirmation: ETT inserted through vocal cords under direct vision,  positive ETCO2,  CO2 detector and breath sounds checked- equal and bilateral Secured at: 23 cm Tube secured with: Tape Dental Injury: Teeth and Oropharynx as per pre-operative assessment

## 2011-04-20 NOTE — Transfer of Care (Signed)
Immediate Anesthesia Transfer of Care Note  Patient: Scott York  Procedure(s) Performed: Procedure(s) (LRB): APPENDECTOMY LAPAROSCOPIC (N/A) APPENDECTOMY (N/A)  Patient Location: PACU  Anesthesia Type: General  Level of Consciousness: oriented, sedated, patient cooperative and responds to stimulation  Airway & Oxygen Therapy: Patient Spontanous Breathing and Patient connected to nasal cannula oxygen  Post-op Assessment: Report given to PACU RN, Post -op Vital signs reviewed and stable, Patient moving all extremities and Patient moving all extremities X 4  Post vital signs: Reviewed and stable  Complications: No apparent anesthesia complications

## 2011-04-20 NOTE — ED Notes (Signed)
Pt still unable to give urine sample

## 2011-04-20 NOTE — ED Provider Notes (Signed)
History     CSN: [id number]  Arrival date & time 04/20/11  0756   First MD Initiated Contact with Patient 04/20/11 [phone]      Chief Complaint  Patient presents with  . Shortness of Breath    (Consider location/radiation/quality/duration/timing/severity/associated sxs/prior treatment) The history is provided by the patient and the spouse.   the patient is a 46 year old morbidly obese male, with a history of hypertension, who presents emergency department complaining of sudden onset of right upper abdominal pain since last night.  He was sitting talking to his wife, when his symptoms began.  He says the pain is so severe that it is hard for him to take a deep breath.  He denies shortness of breath. He denies chest pain.  He denies fevers, chills, cough, leg pain or swelling.  He denies recent travel or surgery.  He does say that he is nauseated.  He has not had vomiting, or diarrhea.  He denies hematuria or dysuria.  He has never had similar symptoms in the past.  He has never had abdominal surgery in the past  Past Medical History  Diagnosis Date  . Hypertension     Past Surgical History  Procedure Date  . Tonsillectomy   . Fracture surgery     No family history on file.  History  Substance Use Topics  . Smoking status: Current Some Day Smoker    Types: Cigars  . Smokeless tobacco: Not on file  . Alcohol Use: No      Review of Systems  Constitutional: Negative for fever and chills.  Respiratory: Negative for shortness of breath.   Cardiovascular: Negative for chest pain.  Gastrointestinal: Positive for nausea and abdominal pain. Negative for vomiting and diarrhea.  Genitourinary: Negative for dysuria and hematuria.  All other systems reviewed and are negative.    Allergies  Review of patient's allergies indicates not on file.  Home Medications  No current outpatient prescriptions on file.  BP 122/55  Pulse 84  Temp(Src) 99.1 F (37.3 C) (Oral)  Resp 20   SpO2 100%  Physical Exam  Vitals reviewed. Constitutional: He is oriented to person, place, and time. No distress.       Morbidly obese.  Holding right upper quadrant with his hand  HENT:  Head: Normocephalic and atraumatic.  Eyes: Conjunctivae are normal.  Neck: Normal range of motion.  Cardiovascular: Normal rate.   No murmur heard. Pulmonary/Chest: Effort normal. No respiratory distress. He has no wheezes. He has no rales.       Splinting because of pain when he takes a deep breath  Abdominal: Soft. There is tenderness. There is no rebound and no guarding.       Right upper quadrant tenderness, with Murphy sign  Musculoskeletal: Normal range of motion.  Neurological: He is alert and oriented to person, place, and time.  Skin: Skin is warm and dry.  Psychiatric: He has a normal mood and affect. Thought content normal.    ED Course  Procedures (including critical care time) Morbidly obese male, with right upper quadrant pain, tenderness and positive Murphy's sign.  He says it hurts to take a deep breath, but is not short of breath.  He has no risk factors for a pulmonary embolism.  We will establish an IV perform laboratory testing, including a cemented lipase, and urinalysis to evaluate for hepatobiliary disease, versus renal disease.  I will give him IV analgesics, and antiemetics, and also order an ultrasound of his abdomen  to look for hepatobiliary disease, which is  The most likely etiology for his symptoms   Labs Reviewed  COMPREHENSIVE METABOLIC PANEL  CBC  DIFFERENTIAL  LIPASE, BLOOD  URINALYSIS, ROUTINE W REFLEX MICROSCOPIC   No results found.   No diagnosis found.  10:13 AM Still has pain and ttp in ruq with murphy's. Will discuss with surg.   10:37 AM Dr. Dwain Sarna is in OR.  Spoke with the or nurse.  She will pass on message that I think pt should be seen for cholecystectomy.  MDM  Abdominal pain Cholelithiasis with leukocytosis        Cheri Guppy, MD 04/20/11 1528

## 2011-04-20 NOTE — ED Notes (Signed)
Pt states last night around 11pm pt was sitting and then he turned towards the kitchen and felt a sharp pain on the right side at the bottom of the rib cage.  Pt now c/o SOB and continues to have pain on the right side.

## 2011-04-20 NOTE — ED Notes (Signed)
Pt has a urinal and knows we need a urine sample 

## 2011-04-20 NOTE — H&P (Signed)
Scott York is an 46 y.o. male.   Chief Complaint: ab pain HPI:  This is a 46 year old male who has a history of hypertension who presents after beginning to have right upper quadrant right-sided abdominal pain beginning at 4 PM last night. This occurred while he was walking and was not really associated with eating. He has no prior history of any pain like this. He has nausea associated with this. He denies any fevers. He has induced his own emesis. This did not make him feel better. Nothing he was doing at home was really relieving the pain. It is aggravated by taking a deep breath. He came in for evaluation. He has no history of any changes in his bowel habits, constipation, diarrhea, or any blood in his stool. He has no history of dysphagia.  Past Medical History  Diagnosis Date  . Hypertension     Past Surgical History  Procedure Date  . Tonsillectomy   . Fracture surgery     No family history on file. Social History:  reports that he has been smoking Cigars.  He does not have any smokeless tobacco history on file. He reports that he does not drink alcohol or use illicit drugs.  Allergies: No Known Allergies  Medications Prior to Admission  Medication Dose Route Frequency Provider Last Rate Last Dose  . 0.9 %  sodium chloride infusion   Intravenous Continuous Cheri Guppy, MD 125 mL/hr at 04/20/11 0844 125 mL/hr at 04/20/11 0844  . iohexol (OMNIPAQUE) 300 MG/ML solution 20 mL  20 mL Oral Q1 Hr x 2 Medication Radiologist, MD   20 mL at 04/20/11 1220  . morphine 4 MG/ML injection 4 mg  4 mg Intravenous Once Cheri Guppy, MD   4 mg at 04/20/11 0845  . morphine 4 MG/ML injection 4 mg  4 mg Intravenous Once Renne Crigler, PA   4 mg at 04/20/11 1128  . ondansetron (ZOFRAN) injection 4 mg  4 mg Intravenous Once Cheri Guppy, MD   4 mg at 04/20/11 0845   Medications Prior to Admission  Medication Sig Dispense Refill  . hydrochlorothiazide (HYDRODIURIL) 25 MG tablet  Take 25 mg by mouth daily.      . nebivolol (BYSTOLIC) 10 MG tablet Take 10 mg by mouth daily.        Results for orders placed during the hospital encounter of 04/20/11 (from the past 48 hour(s))  COMPREHENSIVE METABOLIC PANEL     Status: Abnormal   Collection Time   04/20/11  8:27 AM      Component Value Range Comment   Sodium 141  135 - 145 (mEq/L)    Potassium 4.0  3.5 - 5.1 (mEq/L)    Chloride 103  96 - 112 (mEq/L)    CO2 27  19 - 32 (mEq/L)    Glucose, Bld 111 (*) 70 - 99 (mg/dL)    BUN 14  6 - 23 (mg/dL)    Creatinine, Ser 1.61  0.50 - 1.35 (mg/dL)    Calcium 9.7  8.4 - 10.5 (mg/dL)    Total Protein 7.3  6.0 - 8.3 (g/dL)    Albumin 3.9  3.5 - 5.2 (g/dL)    AST 28  0 - 37 (U/L)    ALT 32  0 - 53 (U/L)    Alkaline Phosphatase 67  39 - 117 (U/L)    Total Bilirubin 1.0  0.3 - 1.2 (mg/dL)    GFR calc non Af Amer >90  >90 (mL/min)  GFR calc Af Amer >90  >90 (mL/min)   CBC     Status: Abnormal   Collection Time   04/20/11  8:27 AM      Component Value Range Comment   WBC 15.7 (*) 4.0 - 10.5 (K/uL)    RBC 4.95  4.22 - 5.81 (MIL/uL)    Hemoglobin 15.0  13.0 - 17.0 (g/dL)    HCT 11.9  14.7 - 82.9 (%)    MCV 85.9  78.0 - 100.0 (fL)    MCH 30.3  26.0 - 34.0 (pg)    MCHC 35.3  30.0 - 36.0 (g/dL)    RDW 56.2  13.0 - 86.5 (%)    Platelets 243  150 - 400 (K/uL)   DIFFERENTIAL     Status: Abnormal   Collection Time   04/20/11  8:27 AM      Component Value Range Comment   Neutrophils Relative 86 (*) 43 - 77 (%)    Neutro Abs 13.6 (*) 1.7 - 7.7 (K/uL)    Lymphocytes Relative 4 (*) 12 - 46 (%)    Lymphs Abs 0.6 (*) 0.7 - 4.0 (K/uL)    Monocytes Relative 9  3 - 12 (%)    Monocytes Absolute 1.5 (*) 0.1 - 1.0 (K/uL)    Eosinophils Relative 0  0 - 5 (%)    Eosinophils Absolute 0.0  0.0 - 0.7 (K/uL)    Basophils Relative 0  0 - 1 (%)    Basophils Absolute 0.0  0.0 - 0.1 (K/uL)   LIPASE, BLOOD     Status: Normal   Collection Time   04/20/11  8:27 AM      Component Value Range  Comment   Lipase 22  11 - 59 (U/L)    US Abdomen Complete  04/20/2011  *RADIOLOGY REPORT*  Clinical Data:  46 year old male with right-sided abdominal pain.  ABDOMINAL ULTRASOUND COMPLETE  Comparison:  None  Findings:  Gallbladder: At least two small mobile gallstones are noted, the largest measuring 5 mm.  There is no evidence of gallbladder wall thickening, pericholecystic fluid or sonographic Murphy's sign.  Common Bile Duct:  There is no evidence of intrahepatic or extrahepatic biliary dilation. The CBD measures 4.5 mm in greatest diameter.  Liver:  The liver is within normal limits in parenchymal echogenicity. No focal abnormalities are identified.  IVC:  Appears normal.  Pancreas:  Although the pancreas is difficult to visualize in its entirety, no focal pancreatic abnormality is identified.  Spleen:  Within normal limits in size and echotexture.  Right kidney:  The right kidney is normal in size and parenchymal echogenicity.  There is no evidence of solid mass, hydronephrosis or definite renal calculi.  The right kidney measures 12.7 cm.  Left kidney:  The left kidney is normal in size and parenchymal echogenicity.  There is no evidence of solid mass, hydronephrosis or definite renal calculi.   The left kidney measures 12.2 cm.  Abdominal Aorta:  No abdominal aortic aneurysm identified.  There is no evidence of ascites.  IMPRESSION: Cholelithiasis without evidence of acute cholecystitis.  No other significant abnormalities identified.  Original Report Authenticated By: Rosendo Gros, M.D.    Review of Systems  Constitutional: Negative for fever and chills.  Gastrointestinal: Positive for nausea, vomiting (self induced) and abdominal pain. Negative for diarrhea, constipation and blood in stool.    Blood pressure 162/57, pulse 95, temperature 99.1 F (37.3 C), temperature source Oral, resp. rate 20, SpO2 95.00%. Physical Exam  Vitals reviewed. Constitutional: He appears well-developed and  well-nourished.  Eyes: No scleral icterus.  Neck: Neck supple.  Cardiovascular: Normal rate, regular rhythm and normal heart sounds.   Respiratory: Effort normal and breath sounds normal. He has no wheezes. He has no rales.  GI: Soft. There is tenderness (tender right upper quadrant laterally and into mid right abdomen) in the right upper quadrant. There is no CVA tenderness. No hernia.  Lymphadenopathy:    He has no cervical adenopathy.     Assessment/Plan Right sided abdominal pain  I was initially consulted for gallstones but his exam, wbc and u/s did not really correlate.  I ordered a ct scan which shows likely ruptured appendicitis. There is not abscess or anything that I think conservative therapy would be best choice.  He and I discussed lap appy with risks being but not limited to bleeding, infection, abscess requiring drainage postop, ileus, open procedure, need for more extensive bowel resection.  He understands and we will proceed as soon as possible.  Scott York,Scott York 04/20/2011, 1:03 PM

## 2011-04-21 ENCOUNTER — Encounter (HOSPITAL_COMMUNITY): Payer: Self-pay | Admitting: *Deleted

## 2011-04-21 LAB — CBC
HCT: 39.3 % (ref 39.0–52.0)
Hemoglobin: 13 g/dL (ref 13.0–17.0)
RBC: 4.47 MIL/uL (ref 4.22–5.81)
WBC: 12.8 10*3/uL — ABNORMAL HIGH (ref 4.0–10.5)

## 2011-04-21 LAB — BASIC METABOLIC PANEL
Chloride: 105 mEq/L (ref 96–112)
GFR calc Af Amer: >90 mL/min (ref >90)
Potassium: 4.1 mEq/L (ref 3.5–5.1)

## 2011-04-21 MED ORDER — BIOTENE DRY MOUTH MT LIQD
15.0000 mL | Freq: Two times a day (BID) | OROMUCOSAL | Status: DC
  Administered 2011-04-21 – 2011-04-24 (×5): 15 mL via OROMUCOSAL

## 2011-04-21 MED ORDER — HYDROMORPHONE 0.3 MG/ML IV SOLN
INTRAVENOUS | Status: AC
  Administered 2011-04-21: 4.08 mg via INTRAVENOUS
  Filled 2011-04-21: qty 25

## 2011-04-21 MED ORDER — NEBIVOLOL HCL 10 MG PO TABS
10.0000 mg | ORAL_TABLET | Freq: Every day | ORAL | Status: DC
  Administered 2011-04-22 – 2011-04-25 (×4): 10 mg via ORAL
  Filled 2011-04-21 (×5): qty 1

## 2011-04-21 MED ORDER — CHLORHEXIDINE GLUCONATE 0.12 % MT SOLN
15.0000 mL | Freq: Two times a day (BID) | OROMUCOSAL | Status: DC
  Administered 2011-04-21 – 2011-04-24 (×7): 15 mL via OROMUCOSAL
  Filled 2011-04-21 (×4): qty 15

## 2011-04-21 MED ORDER — HYDROMORPHONE 0.3 MG/ML IV SOLN
INTRAVENOUS | Status: AC
  Filled 2011-04-21: qty 25

## 2011-04-21 NOTE — Progress Notes (Signed)
Subjective: Minimal pain in RLQ. No flatus or BM yet. Has urinary catheter. PCA adequate pain control.  Objective: Vital signs in last 24 hours: Temp:  [98.1 F (36.7 C)-102.6 F (39.2 C)] 98.7 F (37.1 C) (04/15 0941) Pulse Rate:  [66-95] 72  (04/15 0941) Resp:  [18-32] 18  (04/15 0941) BP: (98-162)/(39-81) 98/39 mmHg (04/15 0941) SpO2:  [94 %-97 %] 96 % (04/15 0941) Weight:  [267 lb 15.9 oz (121.56 kg)-268 lb (121.564 kg)] 267 lb 15.9 oz (121.56 kg) (04/15 0129) Weight change:  Last BM Date: 04/20/11  Intake/Output from previous day: 04/14 0701 - 04/15 0700 In: 2800 [I.V.:2800] Out: 1125 [Urine:625; Drains:50; Blood:450] Intake/Output this shift: Total I/O In: 800 [I.V.:800] Out: 420 [Urine:350; Drains:70]  General appearance: alert, cooperative and no distress Head: Normocephalic, without obvious abnormality, atraumatic Resp: clear to auscultation bilaterally Cardio: regular rate and rhythm, S1, S2 normal, no murmur, click, rub or gallop GI: Obese, NT, BS+, wound vac in place central wound, right abd drain in place with serosanguinous fluid. Neurologic: Grossly normal  Lab Results:  Basename 04/21/11 0630 04/20/11 0827  WBC 12.8* 15.7*  HGB 13.0 15.0  HCT 39.3 42.5  PLT 213 243   BMET  Basename 04/21/11 0630 04/20/11 0827  NA 139 141  K 4.1 4.0  CL 105 103  CO2 23 27  GLUCOSE 141* 111*  BUN 11 14  CREATININE 0.79 0.88  CALCIUM 8.4 9.7    Studies/Results: US Abdomen Complete  04/20/2011  *RADIOLOGY REPORT*  Clinical Data:  47 year old male with right-sided abdominal pain.  ABDOMINAL ULTRASOUND COMPLETE  Comparison:  None  Findings:  Gallbladder: At least two small mobile gallstones are noted, the largest measuring 5 mm.  There is no evidence of gallbladder wall thickening, pericholecystic fluid or sonographic Murphy's sign.  Common Bile Duct:  There is no evidence of intrahepatic or extrahepatic biliary dilation. The CBD measures 4.5 mm in greatest  diameter.  Liver:  The liver is within normal limits in parenchymal echogenicity. No focal abnormalities are identified.  IVC:  Appears normal.  Pancreas:  Although the pancreas is difficult to visualize in its entirety, no focal pancreatic abnormality is identified.  Spleen:  Within normal limits in size and echotexture.  Right kidney:  The right kidney is normal in size and parenchymal echogenicity.  There is no evidence of solid mass, hydronephrosis or definite renal calculi.  The right kidney measures 12.7 cm.  Left kidney:  The left kidney is normal in size and parenchymal echogenicity.  There is no evidence of solid mass, hydronephrosis or definite renal calculi.   The left kidney measures 12.2 cm.  Abdominal Aorta:  No abdominal aortic aneurysm identified.  There is no evidence of ascites.  IMPRESSION: Cholelithiasis without evidence of acute cholecystitis.  No other significant abnormalities identified.  Original Report Authenticated By: Rosendo Gros, M.D.   Ct Abdomen Pelvis W Contrast  04/20/2011  *RADIOLOGY REPORT*  Clinical Data: 46 year old male with right-sided abdominal and pelvic pain.  Elevated white count.  CT ABDOMEN AND PELVIS WITH CONTRAST  Technique:  Multidetector CT imaging of the abdomen and pelvis was performed following the standard protocol during bolus administration of intravenous contrast.  Contrast: OMNIPAQUE IOHEXOL 300 MG/ML  SOLN  Comparison: 04/20/2011 ultrasound  Findings: Two separate 6 mm gallstones are identified, one at the neck. No definite gallbladder wall thickening or pericholecystic inflammation is noted.  There is an enlarged thickened appendix with moderate adjacent inflammation, small amount of free fluid  and focus of free air, compatible with ruptured appendicitis.  The appendix lies at the level of the lower right kidney.  The liver, spleen, adrenal glands, kidneys and pancreas are unremarkable.  No enlarged lymph nodes, biliary dilatation or abdominal  aortic aneurysm noted. No other bowel abnormalities are identified. The bladder is unremarkable.  A 2 cm sclerotic right S2 segment lesion (image 73) and a 1 cm sclerotic left iliac bone lesion (image 75) may represent bone islands but nonspecific. No acute bony abnormalities are present. Moderate degenerative disc disease at L5-S1 noted.  IMPRESSION: Appendicitis with rupture.  Cholelithiasis without CT evidence of acute cholecystitis.  Nonspecific sclerotic lesions within the right sacrum and left iliac bone - suspect bone islands.  Consider bone scan if there is a history of primary neoplasm.  These results were called to [facility], R.N. on 04/20/2011 at 4:00 p.m.  Original Report Authenticated By: Rosendo Gros, M.D.    Medications:  Scheduled:   . antiseptic oral rinse  15 mL Mouth Rinse q12n4p  . chlorhexidine  15 mL Mouth Rinse BID  . ertapenem  1 g Intravenous Once  . ertapenem (INVANZ) IV  1 g Intravenous Q24H  . HYDROmorphone      . HYDROmorphone PCA 0.3 mg/mL   Intravenous Q4H  . HYDROmorphone PCA 0.3 mg/mL      . HYDROmorphone PCA 0.3 mg/mL      . iohexol  20 mL Oral Q1 Hr x 2  .  morphine injection  4 mg Intravenous Once  .  morphine injection  4 mg Intravenous Once  . nebivolol  10 mg Oral Daily  . pantoprazole (PROTONIX) IV  40 mg Intravenous QHS  . DISCONTD: nebivolol  10 mg Oral Daily  . DISCONTD: piperacillin-tazobactam (ZOSYN)  IV  3.375 g Intravenous Once   WUJ:[id number], acetaminophen, diphenhydrAMINE, diphenhydrAMINE, iohexol, naloxone, ondansetron, ondansetron (ZOFRAN) IV, sodium chloride, DISCONTD: HYDROmorphone, DISCONTD: ondansetron (ZOFRAN) IV, DISCONTD: sodium chloride irrigation  Assessment/Plan: Acute gangrenous appendicitis with rupture and abscess POD #1 Open appendectomy, drain placement Wound vac Hypertension  1. PCA for pain control until tolerates oral. 2. Continue invanz. Monitor for fever/leukocytosis. 3. Drain output 70cc. Will continue wound  vac for near future. 4. Monitor hypotension while on PCA. Hold parameters placed for beta blocker. 5. NPO, monitor return of bowel function prior to starting clears.  6. DC foley cath   LOS: 1 day   Scott York PGY-2 04/21/2011, 9:59 AM

## 2011-04-21 NOTE — ED Provider Notes (Signed)
Medical screening examination/treatment/procedure(s) were conducted as a shared visit with non-physician practitioner(s) and myself.  I personally evaluated the patient during the encounter  Cheri Guppy, MD 04/21/11 [phone]

## 2011-04-22 LAB — BASIC METABOLIC PANEL
Calcium: 8.5 mg/dL (ref 8.4–10.5)
GFR calc Af Amer: >90 mL/min (ref >90)
GFR calc non Af Amer: >90 mL/min (ref >90)
Glucose, Bld: 98 mg/dL (ref 70–99)
Sodium: 141 mEq/L (ref 135–145)

## 2011-04-22 LAB — CBC
MCH: 29.4 pg (ref 26.0–34.0)
MCHC: 32.9 g/dL (ref 30.0–36.0)
Platelets: 202 10*3/uL (ref 150–400)
RDW: 13.3 % (ref 11.5–15.5)

## 2011-04-22 MED ORDER — HYDROMORPHONE 0.3 MG/ML IV SOLN
INTRAVENOUS | Status: AC
  Administered 2011-04-22: 14:00:00
  Filled 2011-04-22: qty 25

## 2011-04-22 NOTE — Progress Notes (Signed)
UR of chart complete.  

## 2011-04-22 NOTE — Progress Notes (Signed)
Patient ID: Scott York, male   DOB: 1965/11/22, 46 y.o.   MRN: [id number] 2 Days Post-Op  Subjective: Pt feels well.  Pain minimal.  No flatus yet.  Objective: Vital signs in last 24 hours: Temp:  [98 F (36.7 C)-98.7 F (37.1 C)] 98.2 F (36.8 C) (04/16 0618) Pulse Rate:  [67-79] 67  (04/16 0618) Resp:  [15-20] 18  (04/16 0618) BP: (98-132)/(39-72) 115/67 mmHg (04/16 0618) SpO2:  [95 %-99 %] 99 % (04/16 0618) Last BM Date: 04/19/11  Intake/Output from previous day: 04/15 0701 - 04/16 0700 In: 3386 [I.V.:3386] Out: 980 [Urine:750; Drains:230] Intake/Output this shift:    PE: Abd: soft, few BS, ND, obese, appropriately tender, JP with serosang output.  VAC in place.  Lab Results:   Basename 04/22/11 0512 04/21/11 0630  WBC 8.5 12.8*  HGB 11.0* 13.0  HCT 33.4* 39.3  PLT 202 213   BMET  Basename 04/22/11 0512 04/21/11 0630  NA 141 139  K 3.9 4.1  CL 106 105  CO2 26 23  GLUCOSE 98 141*  BUN 18 11  CREATININE 0.85 0.79  CALCIUM 8.5 8.4   PT/INR No results found for this basename: LABPROT:2,INR:2 in the last 72 hours CMP     Component Value Date/Time   NA 141 04/22/2011 0512   K 3.9 04/22/2011 0512   CL 106 04/22/2011 0512   CO2 26 04/22/2011 0512   GLUCOSE 98 04/22/2011 0512   BUN 18 04/22/2011 0512   CREATININE 0.85 04/22/2011 0512   CALCIUM 8.5 04/22/2011 0512   PROT 7.3 04/20/2011 0827   ALBUMIN 3.9 04/20/2011 0827   AST 28 04/20/2011 0827   ALT 32 04/20/2011 0827   ALKPHOS 67 04/20/2011 0827   BILITOT 1.0 04/20/2011 0827   GFRNONAA >90 04/22/2011 0512   GFRAA >90 04/22/2011 0512   Lipase     Component Value Date/Time   LIPASE 22 04/20/2011 0827       Studies/Results: US Abdomen Complete  04/20/2011  *RADIOLOGY REPORT*  Clinical Data:  46 year old male with right-sided abdominal pain.  ABDOMINAL ULTRASOUND COMPLETE  Comparison:  None  Findings:  Gallbladder: At least two small mobile gallstones are noted, the largest measuring 5 mm.  There is no  evidence of gallbladder wall thickening, pericholecystic fluid or sonographic Murphy's sign.  Common Bile Duct:  There is no evidence of intrahepatic or extrahepatic biliary dilation. The CBD measures 4.5 mm in greatest diameter.  Liver:  The liver is within normal limits in parenchymal echogenicity. No focal abnormalities are identified.  IVC:  Appears normal.  Pancreas:  Although the pancreas is difficult to visualize in its entirety, no focal pancreatic abnormality is identified.  Spleen:  Within normal limits in size and echotexture.  Right kidney:  The right kidney is normal in size and parenchymal echogenicity.  There is no evidence of solid mass, hydronephrosis or definite renal calculi.  The right kidney measures 12.7 cm.  Left kidney:  The left kidney is normal in size and parenchymal echogenicity.  There is no evidence of solid mass, hydronephrosis or definite renal calculi.   The left kidney measures 12.2 cm.  Abdominal Aorta:  No abdominal aortic aneurysm identified.  There is no evidence of ascites.  IMPRESSION: Cholelithiasis without evidence of acute cholecystitis.  No other significant abnormalities identified.  Original Report Authenticated By: Rosendo Gros, M.D.   Ct Abdomen Pelvis W Contrast  04/20/2011  *RADIOLOGY REPORT*  Clinical Data: 46 year old male with right-sided abdominal and pelvic  pain.  Elevated white count.  CT ABDOMEN AND PELVIS WITH CONTRAST  Technique:  Multidetector CT imaging of the abdomen and pelvis was performed following the standard protocol during bolus administration of intravenous contrast.  Contrast: OMNIPAQUE IOHEXOL 300 MG/ML  SOLN  Comparison: 04/20/2011 ultrasound  Findings: Two separate 6 mm gallstones are identified, one at the neck. No definite gallbladder wall thickening or pericholecystic inflammation is noted.  There is an enlarged thickened appendix with moderate adjacent inflammation, small amount of free fluid and focus of free air, compatible  with ruptured appendicitis.  The appendix lies at the level of the lower right kidney.  The liver, spleen, adrenal glands, kidneys and pancreas are unremarkable.  No enlarged lymph nodes, biliary dilatation or abdominal aortic aneurysm noted. No other bowel abnormalities are identified. The bladder is unremarkable.  A 2 cm sclerotic right S2 segment lesion (image 73) and a 1 cm sclerotic left iliac bone lesion (image 75) may represent bone islands but nonspecific. No acute bony abnormalities are present. Moderate degenerative disc disease at L5-S1 noted.  IMPRESSION: Appendicitis with rupture.  Cholelithiasis without CT evidence of acute cholecystitis.  Nonspecific sclerotic lesions within the right sacrum and left iliac bone - suspect bone islands.  Consider bone scan if there is a history of primary neoplasm.  These results were called to [facility], R.N. on 04/20/2011 at 4:00 p.m.  Original Report Authenticated By: Rosendo Gros, M.D.    Anti-infectives: Anti-infectives     Start     Dose/Rate Route Frequency Ordered Stop   04/21/11 1700   ertapenem (INVANZ) 1 g in sodium chloride 0.9 % 50 mL IVPB        1 g 100 mL/hr over 30 Minutes Intravenous Every 24 hours 04/20/11 2253     04/20/11 1730   ertapenem (INVANZ) 1 g in sodium chloride 0.9 % 50 mL IVPB        1 g 100 mL/hr over 30 Minutes Intravenous  Once 04/20/11 1630 04/20/11 1743   04/20/11 1630   piperacillin-tazobactam (ZOSYN) IVPB 3.375 g  Status:  Discontinued        3.375 g 12.5 mL/hr over 240 Minutes Intravenous  Once 04/20/11 1621 04/20/11 1630           Assessment/Plan  1. S/p open appy for perf gangrenous appy 2. Post op ileus  Plan: 1. Cont mobilization and pulm toilet 2. Will allow sips of clears as he has no nausea, but still with no flatus yet. 3. Start VAC change today.   LOS: 2 days    Dennise Raabe E 04/22/2011

## 2011-04-23 MED ORDER — CHLORHEXIDINE GLUCONATE 0.12 % MT SOLN
OROMUCOSAL | Status: AC
  Filled 2011-04-23: qty 15

## 2011-04-23 MED ORDER — HYDROMORPHONE 0.3 MG/ML IV SOLN
INTRAVENOUS | Status: AC
  Filled 2011-04-23: qty 25

## 2011-04-23 NOTE — Progress Notes (Signed)
Patient ID: Scott York, male   DOB: 1965-07-18, 46 y.o.   MRN: [id number] 3 Days Post-Op  Subjective: Pt feels much better.  No nausea.  Passing flatus.  Pain is well controlled.  Objective: Vital signs in last 24 hours: Temp:  [97.9 F (36.6 C)-99.4 F (37.4 C)] 98.1 F (36.7 C) (04/17 0528) Pulse Rate:  [62-72] 62  (04/17 0528) Resp:  [16-24] 22  (04/17 0528) BP: (116-155)/(59-93) 155/83 mmHg (04/17 0528) SpO2:  [96 %-100 %] 98 % (04/17 0528) Last BM Date: 04/20/11  Intake/Output from previous day: 04/16 0701 - 04/17 0700 In: 2535 [P.O.:180; I.V.:2355] Out: 817.5 [Urine:750; Drains:67.5] Intake/Output this shift:    PE: Abd: soft, appropriately tender, +BS, obese, VAC in place, JP drain with serosang output.  Lab Results:   Basename 04/22/11 0512 04/21/11 0630  WBC 8.5 12.8*  HGB 11.0* 13.0  HCT 33.4* 39.3  PLT 202 213   BMET  Basename 04/22/11 0512 04/21/11 0630  NA 141 139  K 3.9 4.1  CL 106 105  CO2 26 23  GLUCOSE 98 141*  BUN 18 11  CREATININE 0.85 0.79  CALCIUM 8.5 8.4   PT/INR No results found for this basename: LABPROT:2,INR:2 in the last 72 hours CMP     Component Value Date/Time   NA 141 04/22/2011 0512   K 3.9 04/22/2011 0512   CL 106 04/22/2011 0512   CO2 26 04/22/2011 0512   GLUCOSE 98 04/22/2011 0512   BUN 18 04/22/2011 0512   CREATININE 0.85 04/22/2011 0512   CALCIUM 8.5 04/22/2011 0512   PROT 7.3 04/20/2011 0827   ALBUMIN 3.9 04/20/2011 0827   AST 28 04/20/2011 0827   ALT 32 04/20/2011 0827   ALKPHOS 67 04/20/2011 0827   BILITOT 1.0 04/20/2011 0827   GFRNONAA >90 04/22/2011 0512   GFRAA >90 04/22/2011 0512   Lipase     Component Value Date/Time   LIPASE 22 04/20/2011 0827       Studies/Results: No results found.  Anti-infectives: Anti-infectives     Start     Dose/Rate Route Frequency Ordered Stop   04/21/11 1700   ertapenem (INVANZ) 1 g in sodium chloride 0.9 % 50 mL IVPB        1 g 100 mL/hr over 30 Minutes Intravenous  Every 24 hours 04/20/11 2253     04/20/11 1730   ertapenem (INVANZ) 1 g in sodium chloride 0.9 % 50 mL IVPB        1 g 100 mL/hr over 30 Minutes Intravenous  Once 04/20/11 1630 04/20/11 1743   04/20/11 1630   piperacillin-tazobactam (ZOSYN) IVPB 3.375 g  Status:  Discontinued        3.375 g 12.5 mL/hr over 240 Minutes Intravenous  Once 04/20/11 1621 04/20/11 1630           Assessment/Plan  1. S/p open appy for perf appy  Plan: 1. Advance to clear liquid diet. 2. Will set up [facility] for home use. 3. Cont abx therapy and JP drain.   LOS: 3 days    Mistee Soliman E 04/23/2011

## 2011-04-23 NOTE — Progress Notes (Signed)
Pt with need for NPWT at time of discharge, potentially later this week. Pt offered choice of HH agency and NPWT device provider--elected to use [facility] for both needs. Address in [facility] is correct. The home number listed is actually a fax number according to pt but the two mobile numbers listed, his and his wife's, are correct.

## 2011-04-24 MED ORDER — AMOXICILLIN-POT CLAVULANATE 875-125 MG PO TABS
1.0000 | ORAL_TABLET | Freq: Two times a day (BID) | ORAL | Status: DC
  Administered 2011-04-24 – 2011-04-25 (×3): 1 via ORAL
  Filled 2011-04-24 (×4): qty 1

## 2011-04-24 MED ORDER — PANTOPRAZOLE SODIUM 40 MG PO TBEC
40.0000 mg | DELAYED_RELEASE_TABLET | Freq: Every day | ORAL | Status: DC
  Administered 2011-04-24: 40 mg via ORAL
  Filled 2011-04-24: qty 1

## 2011-04-24 MED ORDER — OXYCODONE-ACETAMINOPHEN 5-325 MG PO TABS
1.0000 | ORAL_TABLET | ORAL | Status: DC | PRN
  Administered 2011-04-24 – 2011-04-25 (×5): 2 via ORAL
  Filled 2011-04-24 (×5): qty 2

## 2011-04-24 NOTE — Progress Notes (Signed)
Patient ID: Scott York, male   DOB: [birth year], 46 y.o.   MRN: [id number] 4 Days Post-Op  Subjective: Pt is feeling great.  He is having BMs and tolerating clear liquids without any complaints.  Ambulating great in the halls  Objective: Vital signs in last 24 hours: Temp:  [97.7 F (36.5 C)-98.5 F (36.9 C)] 98.5 F (36.9 C) (04/18 0534) Pulse Rate:  [62-72] 63  (04/18 0534) Resp:  [18-20] 18  (04/18 0844) BP: (125-151)/(63-77) 127/70 mmHg (04/18 0534) SpO2:  [1 %-100 %] 96 % (04/18 0534) Last BM Date: 04/24/11  Intake/Output from previous day: 04/17 0701 - 04/18 0700 In: 2656 [P.O.:240; I.V.:2416] Out: 2360 [Urine:2175; Drains:185] Intake/Output this shift:    PE: Abd: soft, +BS, ND, obese, VAC in place.  Will look at when this is changed.  JP with serosang output.  Lab Results:   [facility] 04/22/11 0512  WBC 8.5  HGB 11.0*  HCT 33.4*  PLT 202   BMET  Basename 04/22/11 0512  NA 141  K 3.9  CL 106  CO2 26  GLUCOSE 98  BUN 18  CREATININE 0.85  CALCIUM 8.5   PT/INR No results found for this basename: LABPROT:2,INR:2 in the last 72 hours CMP     Component Value Date/Time   NA 141 04/22/2011 0512   K 3.9 04/22/2011 0512   CL 106 04/22/2011 0512   CO2 26 04/22/2011 0512   GLUCOSE 98 04/22/2011 0512   BUN 18 04/22/2011 0512   CREATININE 0.85 04/22/2011 0512   CALCIUM 8.5 04/22/2011 0512   PROT 7.3 04/20/2011 0827   ALBUMIN 3.9 04/20/2011 0827   AST 28 04/20/2011 0827   ALT 32 04/20/2011 0827   ALKPHOS 67 04/20/2011 0827   BILITOT 1.0 04/20/2011 0827   GFRNONAA >90 04/22/2011 0512   GFRAA >90 04/22/2011 0512   Lipase     Component Value Date/Time   LIPASE 22 04/20/2011 0827       Studies/Results: No results found.  Anti-infectives: Anti-infectives     Start     Dose/Rate Route Frequency Ordered Stop   04/24/11 1015   amoxicillin-clavulanate (AUGMENTIN) 875-125 MG per tablet 1 tablet        1 tablet Oral Every 12 hours 04/24/11 1000     04/21/11 1700    ertapenem (INVANZ) 1 g in sodium chloride 0.9 % 50 mL IVPB  Status:  Discontinued        1 g 100 mL/hr over 30 Minutes Intravenous Every 24 hours 04/20/11 2253 04/24/11 1000   04/20/11 1730   ertapenem (INVANZ) 1 g in sodium chloride 0.9 % 50 mL IVPB        1 g 100 mL/hr over 30 Minutes Intravenous  Once 04/20/11 1630 04/20/11 1743   04/20/11 1630   piperacillin-tazobactam (ZOSYN) IVPB 3.375 g  Status:  Discontinued        3.375 g 12.5 mL/hr over 240 Minutes Intravenous  Once 04/20/11 1621 04/20/11 1630           Assessment/Plan  1. S/p open appy for perf appy 2. Post op ileus, resolved  Plan: 1. Advance diet 2. Dc PCA, start oral pain meds 3. Dc IV abx, start po augmentin. 4. Patient agreeable to [facility] at home.   LOS: 4 days    Sharif Rendell E 04/24/2011

## 2011-04-25 ENCOUNTER — Telehealth (INDEPENDENT_AMBULATORY_CARE_PROVIDER_SITE_OTHER): Payer: Self-pay

## 2011-04-25 ENCOUNTER — Telehealth (INDEPENDENT_AMBULATORY_CARE_PROVIDER_SITE_OTHER): Payer: Self-pay | Admitting: General Surgery

## 2011-04-25 MED ORDER — OXYCODONE-ACETAMINOPHEN 5-325 MG PO TABS
1.0000 | ORAL_TABLET | ORAL | Status: AC | PRN
Start: 2011-04-25 — End: 2011-05-05

## 2011-04-25 MED ORDER — AMOXICILLIN-POT CLAVULANATE 875-125 MG PO TABS: 1.0000 | ORAL_TABLET | Freq: Two times a day (BID) | ORAL | Status: AC

## 2011-04-25 NOTE — Telephone Encounter (Signed)
Lillia Abed (with [employer]) calling to offer any assistance with wound vac if needed.  She can be reached on [phone].

## 2011-04-25 NOTE — Discharge Instructions (Signed)
[facility]      [facility] [phone]  OPEN ABDOMINAL SURGERY: POST OP INSTRUCTIONS  Always review your discharge instruction sheet given to you by the facility where your surgery was performed.  IF YOU HAVE DISABILITY OR FAMILY LEAVE FORMS, YOU MUST BRING THEM TO THE OFFICE FOR PROCESSING.  PLEASE DO NOT GIVE THEM TO YOUR DOCTOR.  1. A prescription for pain medication may be given to you upon discharge.  Take your pain medication as prescribed, if needed.  If narcotic pain medicine is not needed, then you may take acetaminophen (Tylenol) or ibuprofen (Advil) as needed. 2. Take your usually prescribed medications unless otherwise directed. 3. If you need a refill on your pain medication, please contact your pharmacy. They will contact our office to request authorization.  Prescriptions will not be filled after 5pm or on week-ends. 4. You should follow a light diet the first few days after arrival home, such as soup and crackers, pudding, etc.unless your doctor has advised otherwise. A high-fiber, low fat diet can be resumed as tolerated.   Be sure to include lots of fluids daily. Most patients will experience some swelling and bruising on the chest and neck area.  Ice packs will help.  Swelling and bruising can take several days to resolve 5. Most patients will experience some swelling and bruising in the area of the incision. Ice pack will help. Swelling and bruising can take several days to resolve..  6. It is common to experience some constipation if taking pain medication after surgery.  Increasing fluid intake and taking a stool softener will usually help or prevent this problem from occurring.  A mild laxative (Milk of Magnesia or Miralax) should be taken according to package directions if there are no bowel movements after 48 hours. 7.  You may have steri-strips (small skin tapes) in place directly over the incision.  These strips should be left on the skin for 7-10 days.  If your  surgeon used skin glue on the incision, you may shower in 24 hours.  The glue will flake off over the next 2-3 weeks.  Any sutures or staples will be removed at the office during your follow-up visit. You may find that a light gauze bandage over your incision may keep your staples from being rubbed or pulled. You may shower and replace the bandage daily. 8. ACTIVITIES:  You may resume regular (light) daily activities beginning the next day--such as daily self-care, walking, climbing stairs--gradually increasing activities as tolerated.  You may have sexual intercourse when it is comfortable.  Refrain from any heavy lifting or straining until approved by your doctor. a. You may drive when you no longer are taking prescription pain medication, you can comfortably wear a seatbelt, and you can safely maneuver your car and apply brakes b. Return to Work: ___________________________________ 9. You should see your doctor in the office for a follow-up appointment approximately two weeks after your surgery.  Make sure that you call for this appointment within a day or two after you arrive home to insure a convenient appointment time. OTHER INSTRUCTIONS:  _____________________________________________________________ _____________________________________________________________  WHEN TO CALL YOUR DOCTOR: 1. Fever over 101.0 2. Inability to urinate 3. Nausea and/or vomiting 4. Extreme swelling or bruising 5. Continued bleeding from incision. 6. Increased pain, redness, or drainage from the incision. 7. Difficulty swallowing or breathing 8. Muscle cramping or spasms. 9. Numbness or tingling in hands or feet or around lips.  The clinic staff is available to   answer your questions during regular business hours.  Please don't hesitate to call and ask to speak to one of the nurses if you have concerns.  For further questions, please visit [email or web address]  Bulb Drain Home Care A bulb drain is a  small, plastic reservoir which creates a gentle suction. It is used to remove excess fluid from a surgical wound. The color and amount of fluid will vary. Immediately after surgery, the fluid is bright red. It may gradually change to a yellow color. When the amount decreases to about 1 or 2 tablespoons (15 to 30 cc) per 24 hours, your caregiver will usually remove it. DAILY CARE  Keep the bulb compressed at all times, except while emptying it. The compression creates suction.   Keep sites where the tubes enter the skin dry and covered with a light bandage (dressing).   Tape the tubes to your skin, 1 to 2 inches below the insertion sites, to keep from pulling on your stitches. Tubes are stitched in place and will not slip out.   Pin the bulb to your shirt (not to your pants) with a safety pin.   For the first few days after surgery, there usually is more fluid in the bulb. Empty the bulb whenever it becomes half full because the bulb does not create enough suction if it is too full. Include this amount in your 24 hour totals.   When the amount of drainage decreases, empty the bulb at the same time every day. Write down the amounts and the 24 hour totals. Your caregiver will want to know them. This helps your caregiver know when the tubes can be removed.   Once you are home, it is no longer necessary to strip the tubes as may have been done in the hospital. If there is drainage around the tube sites, change dressings and keep the area dry. If you see a clot in the tube, leave it alone. However, if the tube does not appear to be draining, let your caregiver know.  TO EMPTY THE BULB  Open the stopper to release suction.   Holding the stopper out of the way, pour drainage into the measuring cup that was sent home with you.   Measure and write down the amount. If there are 2 bulbs, note the amount of drainage from bulb 1 or bulb 2 and keep the totals separate. Your caregiver will want to know which  tube is draining more.   Compress the bulb by folding it in half.   Replace the stopper.   Check the tape that holds the tube to your skin, and pin the bulb to your shirt.  SEEK MEDICAL CARE IF:  The drainage develops a bad odor.   You have an oral temperature above 102 F (38.9 C).   The amount of drainage from your wound suddenly increases or decreases.   You accidentally pull out your drain.   You have any other questions or concerns.  MAKE SURE YOU:   Understand these instructions.   Will watch your condition.   Will get help right away if you are not doing well or get worse.  Document Released: 12/21/1999 Document Revised: 12/12/2010 Document Reviewed: 12/23/2004 [facility] Patient Information 2012 [place], [place].

## 2011-04-25 NOTE — Discharge Summary (Signed)
Patient ID: Scott York MRN: [id number] DOB/AGE: 47-Apr-1967 47 y.o.  Admit date: 04/20/2011 Discharge date: 04/25/2011  Procedures: exploratory laparotomy with appendectomy and drain and VAC placement  Consults: None  Reason for Admission: This is a 46 yo male who presented to [facility] with complaints of abdominal pain.  He was found to have acute appendicitis.  Please see admitting H&P for further details  Admission Diagnoses:  1. Acute perforated appendicitis  Hospital Course: the patient was admitted and placed on IV abx therapy and immediately taken to the operating room.  He started out with a laparoscopic approach, but the appendix was gangrenous and tracking superior towards the gallbladder.  He was then converted to a midline incision and underwent an appendectomy.  He had a drain placed as well as a wound VAC.  He had a postoperative ileus for several day.  On POD# 3, the patient was placed on clear liquids as he began passing flatus.  His diet was advanced as tolerated.  He had several BMs prior to discharge as well.  He was switched to PO augmentin and tolerated this without difficulties.  He was ready for discharge with his drain and VAC in place.  HH has been arranged for [facility] care.  Discharge Diagnoses:  1. Acute perforated appendicitis 2. S/p lap converted to laparotomy with appendectomy  Discharge Medications: Medication List  As of 04/25/2011  9:27 AM   STOP taking these medications         traMADol 50 MG tablet         TAKE these medications         amoxicillin-clavulanate 875-125 MG per tablet   Commonly known as: AUGMENTIN   Take 1 tablet by mouth every 12 (twelve) hours.      aspirin 81 MG chewable tablet   Chew 243 mg by mouth daily.      cyclobenzaprine 10 MG tablet   Commonly known as: FLEXERIL   Take 10 mg by mouth 2 (two) times daily as needed. For back pain      hydrochlorothiazide 25 MG tablet   Commonly known as: HYDRODIURIL   Take 25 mg by  mouth daily.      mulitivitamin with minerals Tabs   Take 1 tablet by mouth daily.      nebivolol 10 MG tablet   Commonly known as: BYSTOLIC   Take 10 mg by mouth daily.      OMEGA 3 1200 MG Caps   Take 1 capsule by mouth daily.      oxyCODONE-acetaminophen 5-325 MG per tablet   Commonly known as: PERCOCET   Take 1-2 tablets by mouth every 4 (four) hours as needed.            Discharge Instructions: Follow-up Information    Follow up with Charolett Bumpers, MD. (As needed)       Follow up with Emelia Loron, MD. (our office will call you)    Contact information:   [facility] [address] [place] [address] [phone]          Signed: Letha Cape 04/25/2011, 9:27 AM

## 2011-04-25 NOTE — Telephone Encounter (Signed)
Please find out what they want. This patient is on dow service and if there are questions about him in hospital need to contact kelly osborne.

## 2011-04-25 NOTE — Progress Notes (Signed)
Discharge instructions/Med Rec Sheet reviewed w/ pt. Abd VAC removed and wet to dry dressing placed to abd. Pt aware of JP drain care. Pt expressed understanding and copies given w/ prescriptions. Pt ambulated off unit in stable condition per his request, accompanied by his wife.

## 2011-04-25 NOTE — Progress Notes (Signed)
Pt d/c home today with [facility] to provide HHRN and NPWT. UR updated and sent to provider at their request. [facility] notified of pt discharge.

## 2011-04-25 NOTE — Telephone Encounter (Signed)
LMOM on cell with f/u appt with Dr Dwain Sarna on 04/30/11 arrive at 9:00am. There was no answer at home but fax machine answering.

## 2011-04-28 ENCOUNTER — Ambulatory Visit (INDEPENDENT_AMBULATORY_CARE_PROVIDER_SITE_OTHER): Payer: PRIVATE HEALTH INSURANCE | Admitting: General Surgery

## 2011-04-28 ENCOUNTER — Encounter (INDEPENDENT_AMBULATORY_CARE_PROVIDER_SITE_OTHER): Payer: Self-pay | Admitting: General Surgery

## 2011-04-28 VITALS — BP 110/80 | HR 78 | Temp 98.8°F | Resp 16 | Ht 65.0 in | Wt 251.2 lb

## 2011-04-28 DIAGNOSIS — Z09 Encounter for follow-up examination after completed treatment for conditions other than malignant neoplasm: Secondary | ICD-10-CM

## 2011-04-28 LAB — CBC WITH DIFFERENTIAL/PLATELET
Eosinophils Absolute: 0.1 10*3/uL (ref 0.0–0.7)
Lymphs Abs: 1.9 10*3/uL (ref 0.7–4.0)
MCH: 28.5 pg (ref 26.0–34.0)
Neutrophils Relative %: 71 % (ref 43–77)
Platelets: 431 10*3/uL — ABNORMAL HIGH (ref 150–400)
RBC: 4.87 MIL/uL (ref 4.22–5.81)
WBC: 11.3 10*3/uL — ABNORMAL HIGH (ref 4.0–10.5)

## 2011-04-28 NOTE — Patient Instructions (Signed)
Twice daily wet to dry dressing changes.

## 2011-04-28 NOTE — Progress Notes (Signed)
Subjective:     Patient ID: Scott York, male   DOB: 02-Oct-1965, 46 y.o.   MRN: [id number]  HPI 51 yom s/p open appy for gangrenous appendicitis.  Has drain in place. I put wound vac on at time of surgery.  Went home on oral abx.  He has been doing well.  He is having bowel movements, denies fevers, eating well.  Had home health changing vac today and had wound probed with significant output superiorly.  He comes in today to have that evaluated. Drain is still putting out mostly serous fluid.   Review of Systems     Objective:   Physical Exam  Abdominal: Soft. Bowel sounds are normal.         Assessment:     S/p open appy    Plan:     His vac last time was not placed deep enough and separated this deep portion of incision.  He had collection of fluid there.  I have opened this completely now.  Will do twice daily wet to dry dressings.  I will check cbc now.  I think this is wound related and not more.  Will see back Wednesday as scheduled.

## 2011-04-29 ENCOUNTER — Telehealth (INDEPENDENT_AMBULATORY_CARE_PROVIDER_SITE_OTHER): Payer: Self-pay | Admitting: General Surgery

## 2011-04-29 NOTE — Telephone Encounter (Signed)
Called to check on patient, he is feeling good. No fevers. He will keep his appt on Wednesday.

## 2011-04-29 NOTE — Telephone Encounter (Signed)
Message copied by Liliana Cline on Tue Apr 29, 2011 11:35 AM ------      Message from: [place], [place]      Created: Tue Apr 29, 2011 10:49 AM       Lesly Rubenstein,      Would you call and see if he is ok today? His wbc was mildly elevated but as long as he is ok I can just see as planned.      MW      ----- Message -----         From: [facility]         Sent: 04/28/2011   7:35 PM           To: Emelia Loron, MD

## 2011-04-30 ENCOUNTER — Ambulatory Visit (INDEPENDENT_AMBULATORY_CARE_PROVIDER_SITE_OTHER): Payer: PRIVATE HEALTH INSURANCE | Admitting: General Surgery

## 2011-04-30 ENCOUNTER — Telehealth (INDEPENDENT_AMBULATORY_CARE_PROVIDER_SITE_OTHER): Payer: Self-pay | Admitting: General Surgery

## 2011-04-30 ENCOUNTER — Encounter (INDEPENDENT_AMBULATORY_CARE_PROVIDER_SITE_OTHER): Payer: Self-pay | Admitting: General Surgery

## 2011-04-30 VITALS — BP 116/76 | HR 68 | Temp 97.5°F | Resp 16 | Ht 65.0 in | Wt 252.2 lb

## 2011-04-30 DIAGNOSIS — Z09 Encounter for follow-up examination after completed treatment for conditions other than malignant neoplasm: Secondary | ICD-10-CM

## 2011-04-30 MED ORDER — HYDROCODONE-ACETAMINOPHEN 5-500 MG PO TABS: 1.0000 | ORAL_TABLET | ORAL | Status: DC | PRN

## 2011-04-30 NOTE — Progress Notes (Signed)
Subjective:     Patient ID: Scott York, male   DOB: 12-23-1965, 46 y.o.   MRN: [id number]  HPI 46 yom s/p open appy for gangrenous appendix.  He had open wound that wasn't packed deep enough and developed a pocket of infection.  I drained this earlier this week.  He returns today doing well without any real complaints.  They are doing bid wet to dry dressing changes.  His drain is not putting out too much.  He has no fevers, tol diet, having bms.   Review of Systems     Objective:   Physical Exam Wound is deep, granulation tissue present, no further infection, I think he has a small dehiscence at base of this incision    Assessment:     S/p open appy     Plan:     Continue dressing changes on wound I removed the drain today Nothing really to do about a possible dehiscence right now and we have discussed likely hernia Will consider vac next week

## 2011-05-01 ENCOUNTER — Telehealth (INDEPENDENT_AMBULATORY_CARE_PROVIDER_SITE_OTHER): Payer: Self-pay

## 2011-05-01 NOTE — Telephone Encounter (Signed)
Called Scott York w/[facility] to notify her that if next week the wound is still looking clean that the wound vac can go back on the pt on Wednesday 05/07/11. [facility] wants to know what wound vac you want ordered the gauze or foam wound vac. [facility] recommends the gauze wound vac for deep abdominal wounds. Pls advise.

## 2011-05-01 NOTE — Telephone Encounter (Signed)
Gauze is fine

## 2011-05-02 ENCOUNTER — Telehealth (INDEPENDENT_AMBULATORY_CARE_PROVIDER_SITE_OTHER): Payer: Self-pay

## 2011-05-02 NOTE — Telephone Encounter (Signed)
Called [facility] manager to give orders on the pt when the new homehealth nurse goes to see the pt if the wound is still improving next week the gauze wound vac can be restarted. The manager took the order and will make sure it happens for the pt.

## 2011-05-07 ENCOUNTER — Telehealth (INDEPENDENT_AMBULATORY_CARE_PROVIDER_SITE_OTHER): Payer: Self-pay | Admitting: General Surgery

## 2011-05-07 NOTE — Telephone Encounter (Signed)
That sounds fine

## 2011-05-07 NOTE — Telephone Encounter (Signed)
Larita Fife, with [facility], calling to verify the wound vac setting at 100.  This is her first visit with this pt, so she wants to verify the setting.  She states the wound looks very good at this time.

## 2011-05-08 ENCOUNTER — Telehealth (INDEPENDENT_AMBULATORY_CARE_PROVIDER_SITE_OTHER): Payer: Self-pay

## 2011-05-08 NOTE — Telephone Encounter (Signed)
Returned pt's call about the RTW note for him to go back on 5/6. I told the pt that I spoke to Dr Dwain Sarna this am and he does not want the pt going back to work until he see's him next week on the appt 5/8. 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 The pt said if he doesn't take this job he will lose it and then when he goes back to work he will be put on light duty. I told the pt that I would call Dr Dwain Sarna again to let him know all these details.   I called Dr Dwain Sarna to tell him the details about the pt's job. Once again Dr Dwain Sarna said he does not want the pt to go back to work until he see's him next week. DR Dwain Sarna doesn't think the pt can do the 8hr job and doesn't feel it's safe for his health to do the job b/c the pt is putting himself at risk. Dr Dwain Sarna said as long as the pt  Knows he is putting him self at risk DR Dwain Sarna cant tell him otherwise about not taking the job.  I called the pt to let him know again how Dr Dwain Sarna feels about the pt returning to work on 5/6 without being seen first. I explained to the pt that Dr Dwain Sarna really wants him to wait till 5/8 but if the pt insists about going back to work on 5/6 to call me and I will give him a note to return to work. The pt said he really doesn't want to put his health at risk or make Dr Dwain Sarna mad so he will call his boss to see if someone can at least start the job until he comes in on 5/8 to see Dr Dwain Sarna.

## 2011-05-14 ENCOUNTER — Ambulatory Visit (INDEPENDENT_AMBULATORY_CARE_PROVIDER_SITE_OTHER): Payer: PRIVATE HEALTH INSURANCE | Admitting: General Surgery

## 2011-05-14 ENCOUNTER — Encounter (INDEPENDENT_AMBULATORY_CARE_PROVIDER_SITE_OTHER): Payer: Self-pay | Admitting: General Surgery

## 2011-05-14 ENCOUNTER — Telehealth (INDEPENDENT_AMBULATORY_CARE_PROVIDER_SITE_OTHER): Payer: Self-pay | Admitting: General Surgery

## 2011-05-14 VITALS — BP 104/62 | HR 64 | Temp 98.9°F | Resp 16 | Ht 65.0 in | Wt 254.1 lb

## 2011-05-14 DIAGNOSIS — Z09 Encounter for follow-up examination after completed treatment for conditions other than malignant neoplasm: Secondary | ICD-10-CM

## 2011-05-14 MED ORDER — HYDROCODONE-ACETAMINOPHEN 5-500 MG PO TABS: 1.0000 | ORAL_TABLET | ORAL | Status: DC | PRN

## 2011-05-14 NOTE — Progress Notes (Signed)
Addended byEmelia Loron on: 05/14/2011 02:56 PM   Modules accepted: Orders

## 2011-05-14 NOTE — Progress Notes (Signed)
Subjective:     Patient ID: Scott York, male   DOB: 1965-02-27, 46 y.o.   MRN: [id number]  HPI 49 yom s/p open appy for gangrenous appendix. He has an open wound that is now having a vac placed on it. He returns today doing well without any real complaints. They are doing bid wet to dry dressing changes. His drain is not putting out too much. He has no fevers, tol diet, having bms.   Review of Systems     Objective:   Physical Exam Open wound with pink granulation tissue, no infection, vac placed again    Assessment:     S/p open appy via upper midline     Plan:     Continue vac changes Refill pain meds May go back to work with restrictions and if not taking pain meds Will see again in 2 weeks.

## 2011-05-16 ENCOUNTER — Telehealth (INDEPENDENT_AMBULATORY_CARE_PROVIDER_SITE_OTHER): Payer: Self-pay

## 2011-05-16 ENCOUNTER — Encounter (HOSPITAL_COMMUNITY): Payer: Self-pay | Admitting: *Deleted

## 2011-05-16 ENCOUNTER — Telehealth (INDEPENDENT_AMBULATORY_CARE_PROVIDER_SITE_OTHER): Payer: Self-pay | Admitting: General Surgery

## 2011-05-16 ENCOUNTER — Emergency Department (HOSPITAL_COMMUNITY)
Admission: EM | Admit: 2011-05-16 | Discharge: 2011-05-16 | Disposition: A | Discharge status: Home / Self Care | Payer: PRIVATE HEALTH INSURANCE | Source: Home / Self Care | Attending: Emergency Medicine | Admitting: Emergency Medicine

## 2011-05-16 DIAGNOSIS — N390 Urinary tract infection, site not specified: Secondary | ICD-10-CM

## 2011-05-16 HISTORY — DX: Obesity, unspecified: E66.9

## 2011-05-16 MED ORDER — PHENAZOPYRIDINE HCL 200 MG PO TABS: 200.0000 mg | ORAL_TABLET | Freq: Three times a day (TID) | ORAL | Status: AC | PRN

## 2011-05-16 MED ORDER — CIPROFLOXACIN HCL 500 MG PO TABS: 500.0000 mg | ORAL_TABLET | Freq: Two times a day (BID) | ORAL | Status: AC

## 2011-05-16 NOTE — Discharge Instructions (Signed)
Finish all of the antibiotics even if you feel better. You'll need a repeat urinalysis after you finish the antibiotics- persistent hematuria needs further evaluation.

## 2011-05-16 NOTE — ED Provider Notes (Signed)
History     CSN: [id number]  Arrival date & time 05/16/11  1321   First MD Initiated Contact with Patient 05/16/11 1327      Chief Complaint  Patient presents with  . Hematuria    (Consider location/radiation/quality/duration/timing/severity/associated sxs/prior treatment) HPI Comments: Patient reports lower abdominal pressure after urinating, urgency, frequency, and passing several clots earlier today. No nausea, vomiting, fevers, abdominal pain, back pain penile discharge, or rash. He is sexually active with his wife only. She is asymptomatic. STDs no concern today. He recently had an indwelling Foley for 4 days status post appendectomy, and he was on multiple antibiotics. Patient is not a diabetic.  ROS as noted in HPI. All other ROS negative.   Patient is a 46 y.o. male presenting with dysuria. The history is provided by the patient. No language interpreter was used.  Dysuria  This is a new problem. The current episode started yesterday. The problem occurs every urination. The problem has not changed since onset.The quality of the pain is described as aching. There has been no fever. He is sexually active. There is no history of pyelonephritis. Associated symptoms include frequency, hematuria, hesitancy and urgency. Pertinent negatives include no chills, no nausea, no vomiting, no discharge and no flank pain. He has tried nothing for the symptoms. His past medical history is significant for catheterization. His past medical history does not include kidney stones.    Past Medical History  Diagnosis Date  . Hypertension   . Cholelithiasis   . Appendicitis   . Obesity     Past Surgical History  Procedure Date  . Tonsillectomy   . Fracture surgery   . Appendectomy 04/20/2011    Procedure: APPENDECTOMY;  Surgeon: Emelia Loron, MD;  [facility];  Service: General;  Laterality: N/A;  Opened @1855 .    Family History  Problem Relation Age of Onset  . Heart disease  Paternal Grandfather     History  Substance Use Topics  . Smoking status: Current Some Day Smoker    Types: Cigars  . Smokeless tobacco: Not on file  . Alcohol Use: No      Review of Systems  Constitutional: Negative for chills.  Gastrointestinal: Negative for nausea and vomiting.  Genitourinary: Positive for dysuria, hesitancy, urgency, frequency and hematuria. Negative for flank pain.    Allergies  Review of patient's allergies indicates no known allergies.  Home Medications   Current Outpatient Rx  Name Route Sig Dispense Refill  . ASPIRIN 81 MG PO CHEW Oral Chew 243 mg by mouth daily.    Marland Kitchen CIPROFLOXACIN HCL 500 MG PO TABS Oral Take 1 tablet (500 mg total) by mouth 2 (two) times daily. X 10 days 20 tablet 0  . HYDROCHLOROTHIAZIDE 25 MG PO TABS Oral Take 25 mg by mouth daily.    . ADULT MULTIVITAMIN W/MINERALS CH Oral Take 1 tablet by mouth daily.    . NEBIVOLOL HCL 10 MG PO TABS Oral Take 10 mg by mouth daily.    . OMEGA 3 1200 MG PO CAPS Oral Take 1 capsule by mouth daily.    Marland Kitchen PHENAZOPYRIDINE HCL 200 MG PO TABS Oral Take 1 tablet (200 mg total) by mouth 3 (three) times daily as needed for pain. 6 tablet 0    BP 104/66  Pulse 74  Temp(Src) 98.8 F (37.1 C) (Oral)  Resp 18  SpO2 98%  Physical Exam  Nursing note and vitals reviewed. Constitutional: He is oriented to person, place, and time. He  appears well-developed and well-nourished.  HENT:  Head: Normocephalic and atraumatic.  Eyes: Conjunctivae and EOM are normal.  Neck: Normal range of motion.  Cardiovascular: Normal rate.   Pulmonary/Chest: Effort normal. No respiratory distress.  Abdominal: Bowel sounds are normal. He exhibits no distension and no mass. There is no tenderness. There is no rebound and no guarding. Hernia confirmed negative in the right inguinal area and confirmed negative in the left inguinal area.       Healing midline surgical scar, dressing clean, dry, intact. No suprapubic or CVA  tenderness.  Genitourinary: Testes normal and penis normal. Cremasteric reflex is present. Circumcised. No penile erythema. No discharge found.       Wife present during exam  Musculoskeletal: Normal range of motion.  Lymphadenopathy:       Right: No inguinal adenopathy present.  Neurological: He is alert and oriented to person, place, and time.  Skin: Skin is warm and dry.  Psychiatric: He has a normal mood and affect. His behavior is normal.    ED Course  Procedures (including critical care time)  Labs Reviewed - No data to display No results found.   1. UTI (lower urinary tract infection)    Udip: Positive nitrate, small lyeuk, large blood, 30 protein. PH 7.0, specific gravity 1.015. Negative glucose, bili, ketones.  MDM  Previous records reviewed. Seen in the ER 04/20/2011, admitted for cholecystitis. Had open appendectomy for gangrenous appendix  nml postop check 5/8, had pain meds refilled. Given patient's recent Foley, with no penile discharge, no history kidney stones, UTI most likely. Sending home on Cipro for 10 days, Pyridium. Discussed he'll need followup with his Dr. have a repeat urinalysis, has persistent hematuria needs further workup. Device the differential includes bladder cancer. Patient was never smoker so this is less likely. Patient agrees with plan.  Luiz Blare, MD 05/16/11 [phone]

## 2011-05-16 NOTE — Telephone Encounter (Signed)
Called Amy back after DR Dwain Sarna reviewed the note about the wound problems. Per Dr Dwain Sarna he wants the pt to do wet to dry dressing changes TID until the pt has a f/u appt with Dr Dwain Sarna. The pt's wife will do the dressing changes. The other issue about the urinating blood now the pt will need to f/u with his PCP about that issue. Amy will call the pt to let him know to go to the PCP about the urine issue.

## 2011-05-16 NOTE — Telephone Encounter (Signed)
Amy, [facility] nurse, calling to report she removed the wound vac to perform wound care and encounter "horrible odor."  She was able to remove a large amount of tissue slough, but odor was persistent.  There is an area from the one o'clock to the four o'clock area that cannot be packed to reapply the wound vac correctly, so she did wet-to-dry instead.  Wife knows how to change these over weekend.  She wanted the physician to be aware.  Also pt told her he is "peeing blood clots."  He called the urgent care where his PCP is, was told the physician who sees him there was off and to call his surgeon about it.  If you need to speak with Amy, please call after 11:00, due to poor cell reception where she is heading now:  [phone].

## 2011-05-16 NOTE — ED Notes (Signed)
PT  REPORTS   HE  HAD  EPISODE  OF   PASSING  BLOOD  WHEN  HE  URINATES    HE  ALSO  REPORT  PERHAPS  TINGLING /  BURNING  SENSATION      WHEN URINATES  -  HE  ALSO    REPORTS  SOME  VAGUE  MILD  L  SIDE  PAIN  ALTHOUGH  NOT  SEVERE  -  PT IS  UP IN ROOM IN NO  SEVERE  DISTRESS    NO  HISTORY OF ANY SIMILAR  EPISODES

## 2011-05-19 ENCOUNTER — Telehealth (INDEPENDENT_AMBULATORY_CARE_PROVIDER_SITE_OTHER): Payer: Self-pay | Admitting: General Surgery

## 2011-05-19 ENCOUNTER — Ambulatory Visit (INDEPENDENT_AMBULATORY_CARE_PROVIDER_SITE_OTHER): Payer: PRIVATE HEALTH INSURANCE | Admitting: General Surgery

## 2011-05-19 ENCOUNTER — Encounter (INDEPENDENT_AMBULATORY_CARE_PROVIDER_SITE_OTHER): Payer: Self-pay | Admitting: General Surgery

## 2011-05-19 VITALS — BP 106/70 | HR 76 | Temp 98.5°F | Resp 16 | Ht 65.0 in | Wt 253.0 lb

## 2011-05-19 DIAGNOSIS — Z09 Encounter for follow-up examination after completed treatment for conditions other than malignant neoplasm: Secondary | ICD-10-CM

## 2011-05-19 DIAGNOSIS — S31109A Unspecified open wound of abdominal wall, unspecified quadrant without penetration into peritoneal cavity, initial encounter: Secondary | ICD-10-CM

## 2011-05-19 LAB — POCT URINALYSIS DIP (DEVICE)
Ketones, ur: NEGATIVE mg/dL
Protein, ur: 30 mg/dL — AB
Specific Gravity, Urine: 1.015 (ref 1.005–1.030)
Urobilinogen, UA: 0.2 mg/dL (ref 0.0–1.0)
pH: 7 (ref 5.0–8.0)

## 2011-05-19 LAB — CBC WITH DIFFERENTIAL/PLATELET
Basophils Absolute: 0 10*3/uL (ref 0.0–0.1)
Eosinophils Absolute: 0.2 10*3/uL (ref 0.0–0.7)
Eosinophils Relative: 3 % (ref 0–5)
HCT: 42.1 % (ref 39.0–52.0)
Lymphocytes Relative: 24 % (ref 12–46)
MCH: 27.9 pg (ref 26.0–34.0)
MCHC: 32.3 g/dL (ref 30.0–36.0)
MCV: 86.3 fL (ref 78.0–100.0)
Monocytes Absolute: 1 10*3/uL (ref 0.1–1.0)
RDW: 12.9 % (ref 11.5–15.5)
WBC: 7.1 10*3/uL (ref 4.0–10.5)

## 2011-05-19 NOTE — Telephone Encounter (Signed)
Amy, nurse with [facility], calling with concern over thick, purulent green drain pooling in wound; extremely foul odor.  Nurse recommends MD should see wound.  Appt made for this afternoon with Dr. Dwain Sarna.  Pt and home health provider aware.

## 2011-05-19 NOTE — Progress Notes (Signed)
Subjective:     Patient ID: Scott York, male   DOB: 1965-06-08, 46 y.o.   MRN: [id number]  HPI This is a 37 who morbidly obese male who ended up undergoing an open appendectomy for gangrenous appendicitis via a upper midline incision. He's been doing well having normal bowel movements and eating just fine. He however has had some difficulty healing his wound. We then have been doing alternating vac therapy and wet-to-dry dressings. We got a call from his home health nursing states that this is foul smelling and he has h had some green drainage from the wound. He reports no fevers.  Review of Systems     Objective:   Physical Exam Wound with pink granulation tissue, at base superiorly he has dehiscence and some purulent discharge    Assessment:     Open wound, ? abscess    Plan:        This may be just that his wound is taking a long time to heal and has a lot of exudate. He is being increased to 3 times a day dressing changes. I'm going to get a CBC and a CT scan as soon as possible to assure that there's nothing intra-abdominal that is associated with this or even that it may have a fistula.

## 2011-05-20 ENCOUNTER — Other Ambulatory Visit: Payer: PRIVATE HEALTH INSURANCE

## 2011-05-20 ENCOUNTER — Ambulatory Visit
Admission: RE | Admit: 2011-05-20 | Discharge: 2011-05-20 | Disposition: A | Discharge status: Home / Self Care | Payer: PRIVATE HEALTH INSURANCE | Source: Ambulatory Visit | Attending: General Surgery | Admitting: General Surgery

## 2011-05-20 DIAGNOSIS — S31109A Unspecified open wound of abdominal wall, unspecified quadrant without penetration into peritoneal cavity, initial encounter: Secondary | ICD-10-CM

## 2011-05-20 MED ORDER — IOHEXOL 300 MG/ML  SOLN
125.0000 mL | Freq: Once | INTRAMUSCULAR | Status: AC | PRN
  Administered 2011-05-20: 125 mL via INTRAVENOUS

## 2011-05-21 ENCOUNTER — Telehealth (INDEPENDENT_AMBULATORY_CARE_PROVIDER_SITE_OTHER): Payer: Self-pay

## 2011-05-21 NOTE — Telephone Encounter (Signed)
Called pt to notify him that the Ct is ok just showing the open wound. The pt advised to continue the TID dressing changes. The pt is concerned about the packing of the wound to close too early before the rest of the packing is to be done on the wound. I advised the pt to call our office if the wound closed too early.

## 2011-05-30 ENCOUNTER — Encounter (INDEPENDENT_AMBULATORY_CARE_PROVIDER_SITE_OTHER): Payer: Self-pay | Admitting: General Surgery

## 2011-05-30 ENCOUNTER — Ambulatory Visit (INDEPENDENT_AMBULATORY_CARE_PROVIDER_SITE_OTHER): Payer: PRIVATE HEALTH INSURANCE | Admitting: General Surgery

## 2011-05-30 ENCOUNTER — Telehealth (INDEPENDENT_AMBULATORY_CARE_PROVIDER_SITE_OTHER): Payer: Self-pay | Admitting: General Surgery

## 2011-05-30 VITALS — BP 124/72 | HR 81 | Temp 97.4°F | Ht 65.0 in | Wt 252.0 lb

## 2011-05-30 DIAGNOSIS — Z09 Encounter for follow-up examination after completed treatment for conditions other than malignant neoplasm: Secondary | ICD-10-CM

## 2011-05-30 MED ORDER — HYDROCODONE-ACETAMINOPHEN 5-500 MG PO TABS: 1.0000 | ORAL_TABLET | ORAL | Status: DC | PRN

## 2011-05-30 NOTE — Telephone Encounter (Signed)
Per Dr. Dwain Sarna, I called [facility] and told them to complete his home care and his wound vac care.  They said they would take care of this for me.

## 2011-05-30 NOTE — Progress Notes (Signed)
Subjective:     Patient ID: Scott York, male   DOB: Sep 16, 1965, 46 y.o.   MRN: [id number]  HPI This is a 14 who morbidly obese male who ended up undergoing an open appendectomy for gangrenous appendicitis via a upper midline incision. He's been doing well having normal bowel movements and eating just fine. He however has had some difficulty healing his wound. I recently did a ct that shows his wound but no other abnormalities.  He is now doing well without any complaints and his wound is much better. Minimal drainage, no longer foul smelling  Review of Systems *RADIOLOGY REPORT*  Clinical Data: Status post appendectomy for gangrenous appendicitis  04/20/2011. Nonhealing wound with persistent drainage.  CT ABDOMEN AND PELVIS WITH CONTRAST  Technique: Multidetector CT imaging of the abdomen and pelvis was  performed following the standard protocol during bolus  administration of intravenous contrast.  Contrast: OMNIPAQUE IOHEXOL 300 MG/ML SOLN  Comparison: Abdominal pelvic CT 04/20/2011.  Findings: The lung bases are clear. There is no pleural effusion.  Calcified gallstones are present within the gallbladder neck.  There is no gallbladder distension, wall thickening or surrounding  inflammatory change. There is no biliary dilatation. The liver,  spleen, pancreas, adrenal glands and kidneys appear normal.  The patient has undergone interval appendectomy. There are  postsurgical changes adjacent to the cecum. No fluid collection or  extravasated contrast is demonstrated. The terminal ileum appears  normal. There is an open abdominal wound 5 cm superior to the  umbilicus. This wound extends to the fascia and is associated with  minimal fluid superficial to the rectus abdominis muscles. There  is no drainable fluid collection. There is no intra-abdominal  fluid collection, extraluminal air or extravasated enteric  contrast.  The urinary bladder, prostate gland and seminal  vesicles appear  stable. There are small pelvic phleboliths. There is degenerative  disc disease at L5-S1.  IMPRESSION:  1. Open supraumbilical abdominal wound as described. No drainable  fluid collection or intra-abdominal extension identified. No  evidence of fistula.  2. No significant pericecal inflammatory changes status post  appendectomy. No evidence of bowel obstruction.  3. Cholelithiasis without evidence of cholecystitis.      Objective:   Physical Exam wound with granulation tissue present, pink, wound dehisced some at base   Assessment:     S/p open appy Open wound     Plan:     Continue dressing changes I will let him go back to work.  He states he will not be lifting and he needs to go back for financial reasons. Will cancel home care Return in 3 weeks.

## 2011-06-20 ENCOUNTER — Encounter (INDEPENDENT_AMBULATORY_CARE_PROVIDER_SITE_OTHER): Payer: Self-pay | Admitting: General Surgery

## 2011-06-20 ENCOUNTER — Ambulatory Visit (INDEPENDENT_AMBULATORY_CARE_PROVIDER_SITE_OTHER): Payer: PRIVATE HEALTH INSURANCE | Admitting: General Surgery

## 2011-06-20 VITALS — BP 126/88 | HR 70 | Temp 98.6°F | Resp 16 | Ht 65.0 in | Wt 254.8 lb

## 2011-06-20 DIAGNOSIS — S31109A Unspecified open wound of abdominal wall, unspecified quadrant without penetration into peritoneal cavity, initial encounter: Secondary | ICD-10-CM

## 2011-06-20 MED ORDER — HYDROCODONE-ACETAMINOPHEN 5-500 MG PO TABS: 1.0000 | ORAL_TABLET | ORAL | Status: DC | PRN

## 2011-06-20 NOTE — Progress Notes (Signed)
Subjective:     Patient ID: Scott York, male   DOB: March 01, 1965, 46 y.o.   MRN: [id number]  HPI  This is a 21 who morbidly obese male who ended up undergoing an open appendectomy for gangrenous appendicitis via a upper midline incision. He's been doing well having normal bowel movements and eating just fine. He however has had some difficulty healing his wound.  This is slowly healing with twice daily dressing changes.  He has had some pain at the site but is otherwise ok.  Review of Systems     Objective:   Physical Exam Healing incision with pink granulation tissue, this is still fairly deep given his body habitus    Assessment:     S/p open appy with open wound    Plan:     Continue dressing changes Will see back in one month or sooner if any problems.

## 2011-06-20 NOTE — Addendum Note (Signed)
Addended byEmelia Loron on: 06/20/2011 09:56 AM   Modules accepted: Orders

## 2011-07-08 ENCOUNTER — Emergency Department (HOSPITAL_COMMUNITY)
Admission: EM | Admit: 2011-07-08 | Discharge: 2011-07-08 | Disposition: A | Discharge status: Home / Self Care | Payer: PRIVATE HEALTH INSURANCE | Source: Home / Self Care | Attending: Emergency Medicine | Admitting: Emergency Medicine

## 2011-07-08 ENCOUNTER — Encounter (HOSPITAL_COMMUNITY): Payer: Self-pay

## 2011-07-08 DIAGNOSIS — N390 Urinary tract infection, site not specified: Secondary | ICD-10-CM

## 2011-07-08 HISTORY — DX: Urinary tract infection, site not specified: N39.0

## 2011-07-08 HISTORY — DX: Other intervertebral disc degeneration, lumbar region without mention of lumbar back pain or lower extremity pain: M51.369

## 2011-07-08 HISTORY — DX: Other intervertebral disc degeneration, lumbar region: M51.36

## 2011-07-08 LAB — POCT URINALYSIS DIP (DEVICE)
Ketones, ur: NEGATIVE mg/dL
Protein, ur: 100 mg/dL — AB
Urobilinogen, UA: 0.2 mg/dL (ref 0.0–1.0)
pH: 5.5 (ref 5.0–8.0)

## 2011-07-08 MED ORDER — CIPROFLOXACIN HCL 500 MG PO TABS: 500.0000 mg | ORAL_TABLET | Freq: Two times a day (BID) | ORAL | Status: AC

## 2011-07-08 NOTE — ED Provider Notes (Signed)
History     CSN: [id number]  Arrival date & time 07/08/11  1705   First MD Initiated Contact with Patient 07/08/11 1753      Chief Complaint  Patient presents with  . Hematuria    (Consider location/radiation/quality/duration/timing/severity/associated sxs/prior treatment) HPI Comments: Patient reports dark, cloudy, reddish urine starting today. No urinary urgency, frequency, dysuria, abdominal pain, flank pain, back pain, testicular pain or swelling. No nausea, vomiting, fevers. He was seen for hematuria on 5/10, thought to have UTI from a recent Foley for an emergent appendctomy. He was sent home with Cipro, and states that his symptoms completely resolved. Patient had an abdominal CT with contrast several days later, which was negative for any renal pathology. No history of kidney stones, glomerulonephritis, bladder cancer. He is a former smoker. Past medical history significant for an emergent appendectomy in May, which he states is healing well. Denies any pain, drainage from the incision site. Patient taking baby aspirin, but denies taking any other antiplatelets or blood thinners. He is sexually active with his wife, who is asymptomatic. STDs not a concern today.  ROS as noted in HPI. All other ROS negative.   Patient is a 46 y.o. male presenting with hematuria. The history is provided by the patient.  Hematuria This is a recurrent problem. The current episode started today. He describes the hematuria as gross hematuria. The hematuria occurs throughout his entire urinary stream. He reports no clotting in his urine stream. He is experiencing no pain. He describes his urine color as dark red. Irritative symptoms do not include frequency or urgency. Obstructive symptoms do not include dribbling, straining or a weak stream. Pertinent negatives include no abdominal pain, chills, dysuria, fever, flank pain, hesitancy, nausea or vomiting. He is sexually active. His past medical history is  significant for hypertension and tobacco use. There is no history of BPH, GU trauma, kidney stones, prostatitis or STDs.    Past Medical History  Diagnosis Date  . Hypertension   . Cholelithiasis   . Obesity   . DDD (degenerative disc disease), lumbar     L5-S1  . UTI (lower urinary tract infection)     Past Surgical History  Procedure Date  . Tonsillectomy   . Fracture surgery   . Appendectomy 04/20/2011    Procedure: APPENDECTOMY;  Surgeon: Emelia Loron, MD;  Location: [facility] OR;  Service: General;  Laterality: N/A;  Opened @1855 .    Family History  Problem Relation Age of Onset  . Heart disease Paternal Grandfather     History  Substance Use Topics  . Smoking status: Former Smoker    Types: Cigars    Quit date: 04/30/2011  . Smokeless tobacco: Never Used  . Alcohol Use: No      Review of Systems  Constitutional: Negative for fever and chills.  Gastrointestinal: Negative for nausea, vomiting and abdominal pain.  Genitourinary: Positive for hematuria. Negative for dysuria, hesitancy, urgency, frequency and flank pain.    Allergies  Review of patient's allergies indicates no known allergies.  Home Medications   Current Outpatient Rx  Name Route Sig Dispense Refill  . ASPIRIN 81 MG PO CHEW Oral Chew 243 mg by mouth daily.    Marland Kitchen HYDROCHLOROTHIAZIDE 25 MG PO TABS Oral Take 25 mg by mouth daily.    . ADULT MULTIVITAMIN W/MINERALS CH Oral Take 1 tablet by mouth daily.    . NEBIVOLOL HCL 10 MG PO TABS Oral Take 10 mg by mouth daily.    Ailene Ards  3 1200 MG PO CAPS Oral Take 1 capsule by mouth daily.    Marland Kitchen CIPROFLOXACIN HCL 500 MG PO TABS Oral Take 1 tablet (500 mg total) by mouth 2 (two) times daily. X 7 days 14 tablet 0  . CYCLOBENZAPRINE HCL 10 MG PO TABS        BP 129/71  Pulse 61  Temp 98.9 F (37.2 C) (Oral)  Resp 18  SpO2 97%  Physical Exam  Nursing note and vitals reviewed. Constitutional: He is oriented to person, place, and time. He appears  well-developed and well-nourished.  HENT:  Head: Normocephalic and atraumatic.  Eyes: Conjunctivae and EOM are normal.  Neck: Normal range of motion.  Cardiovascular: Regular rhythm.   Pulmonary/Chest: Effort normal. No respiratory distress.  Abdominal: Soft. Bowel sounds are normal. He exhibits no distension and no mass. There is no tenderness. There is no rebound, no guarding and no CVA tenderness. No hernia. Hernia confirmed negative in the right inguinal area and confirmed negative in the left inguinal area.       Clean, dry, intact, nontender Dressing midline upper abdomen.   Genitourinary: Testes normal and penis normal. Cremasteric reflex is present. Circumcised. No penile erythema or penile tenderness. No discharge found.       no rash. Pt declined chaperone.   Musculoskeletal: Normal range of motion. He exhibits no edema and no tenderness.  Lymphadenopathy:       Right: No inguinal adenopathy present.       Left: No inguinal adenopathy present.  Neurological: He is alert and oriented to person, place, and time.  Skin: Skin is warm and dry. No rash noted.  Psychiatric: He has a normal mood and affect. His behavior is normal.    ED Course  Procedures (including critical care time)  Labs Reviewed  POCT URINALYSIS DIP (DEVICE) - Abnormal; Notable for the following:    Bilirubin Urine SMALL (*)     Hgb urine dipstick LARGE (*)     Protein, ur 100 (*)     Nitrite POSITIVE (*)     Leukocytes, UA MODERATE (*)  Biochemical Testing Only. Please order routine urinalysis from main lab if confirmatory testing is needed.   All other components within normal limits  URINE CULTURE   No results found.   1. UTI (lower urinary tract infection)     Results for orders placed during the hospital encounter of 07/08/11  POCT URINALYSIS DIP (DEVICE)      Component Value Range   Glucose, UA NEGATIVE  NEGATIVE mg/dL   Bilirubin Urine SMALL (*) NEGATIVE   Ketones, ur NEGATIVE  NEGATIVE mg/dL    Specific Gravity, Urine 1.025  1.005 - 1.030   Hgb urine dipstick LARGE (*) NEGATIVE   pH 5.5  5.0 - 8.0   Protein, ur 100 (*) NEGATIVE mg/dL   Urobilinogen, UA 0.2  0.0 - 1.0 mg/dL   Nitrite POSITIVE (*) NEGATIVE   Leukocytes, UA MODERATE (*) NEGATIVE     MDM  Previous records and CT abd reviewed. As noted in history of present illness. Abdomen benign today. Sending urine off for culture, patient home with Cipro for 7 days. Will have him strain all his urine. He will followup with a urologist if this does not resolve, or if he notes any stones with urinating. Discussed with pt that bladder cancer is a cause of painless hematuria.  Discussed  symptoms that should prompt his return to the emergency room. Patient agrees with plan  Danella Maiers  Chaney Malling, MD 07/08/11 [phone]

## 2011-07-08 NOTE — ED Notes (Signed)
C/o blood in urine that started today.  Denies pain or urinary sx.  States seen here last month for the same.

## 2011-07-10 LAB — URINE CULTURE: Colony Count: >=100000

## 2011-07-15 NOTE — ED Notes (Signed)
Urine culture: >100,000 colonies E. Coli.  Pt. Adequately treated with Cipro. Vassie Moselle 07/15/2011

## 2011-07-28 ENCOUNTER — Ambulatory Visit (INDEPENDENT_AMBULATORY_CARE_PROVIDER_SITE_OTHER): Payer: PRIVATE HEALTH INSURANCE | Admitting: General Surgery

## 2011-07-28 ENCOUNTER — Encounter (INDEPENDENT_AMBULATORY_CARE_PROVIDER_SITE_OTHER): Payer: Self-pay | Admitting: General Surgery

## 2011-07-28 VITALS — BP 134/70 | HR 64 | Temp 97.1°F | Resp 16 | Ht 65.0 in | Wt 256.0 lb

## 2011-07-28 DIAGNOSIS — S31109A Unspecified open wound of abdominal wall, unspecified quadrant without penetration into peritoneal cavity, initial encounter: Secondary | ICD-10-CM

## 2011-07-28 DIAGNOSIS — Z09 Encounter for follow-up examination after completed treatment for conditions other than malignant neoplasm: Secondary | ICD-10-CM

## 2011-07-28 MED ORDER — HYDROCODONE-ACETAMINOPHEN 10-325 MG PO TABS: 1.0000 | ORAL_TABLET | Freq: Every day | ORAL | Status: DC

## 2011-07-28 MED ORDER — HYDROCODONE-ACETAMINOPHEN 10-325 MG PO TABS
1.0000 | ORAL_TABLET | Freq: Four times a day (QID) | ORAL | Status: AC | PRN
Start: 2011-07-28 — End: 2012-07-27

## 2011-07-28 NOTE — Progress Notes (Signed)
Subjective:     Patient ID: Scott York, male   DOB: Apr 27, 1965, 46 y.o.   MRN: [id number]  HPI This is a 46 who morbidly obese male who ended up undergoing an open appendectomy for gangrenous appendicitis via a upper midline incision. He's been doing well having normal bowel movements and eating just fine. Wound is slowly healing with dressing changes and he returns today with a much smaller wound. No complaints except for some pain. No bulging at site   Review of Systems     Objective:   Physical Exam His wound is now about a centimeter in diameter with a cavity below at about the size of a pencil. This is still about 3-4 cm deep. There is no infection associated with it and has good pink granulation tissue present. I repacked this today.    Assessment:     Status post open appendectomy for gangrenous appendicitis with healing wound    Plan:     Scott York continue his dressing changes. I think he should heal over the next several weeks. I released and the full activity except for swimming now. I again told him he is at high risk of having a hernia in the future. I will see him back in 2 months. I did refill is hydrocodone today due to some pain with his dressing changes. He knows he cannot work and use hydrocodone.

## 2011-09-29 ENCOUNTER — Encounter (INDEPENDENT_AMBULATORY_CARE_PROVIDER_SITE_OTHER): Payer: PRIVATE HEALTH INSURANCE | Admitting: General Surgery

## 2011-10-07 ENCOUNTER — Encounter (INDEPENDENT_AMBULATORY_CARE_PROVIDER_SITE_OTHER): Payer: Self-pay | Admitting: General Surgery

## 2012-06-03 ENCOUNTER — Emergency Department: Payer: Self-pay | Admitting: Emergency Medicine

## 2012-06-08 ENCOUNTER — Ambulatory Visit: Payer: Self-pay | Admitting: Orthopedic Surgery

## 2012-06-08 DIAGNOSIS — I10 Essential (primary) hypertension: Secondary | ICD-10-CM

## 2012-08-31 ENCOUNTER — Other Ambulatory Visit: Payer: Self-pay | Admitting: Nurse Practitioner

## 2012-08-31 ENCOUNTER — Ambulatory Visit
Admission: RE | Admit: 2012-08-31 | Discharge: 2012-08-31 | Disposition: A | Discharge status: Home / Self Care | Payer: PRIVATE HEALTH INSURANCE | Source: Ambulatory Visit | Attending: Nurse Practitioner | Admitting: Nurse Practitioner

## 2012-08-31 DIAGNOSIS — R609 Edema, unspecified: Secondary | ICD-10-CM

## 2014-04-28 NOTE — Op Note (Signed)
PATIENT NAME:  Scott York, Scott York MR#:  [id number] DATE OF BIRTH:  1965-07-07  DATE OF PROCEDURE:  06/08/2012  PREOPERATIVE DIAGNOSIS:  Left ring finger traumatic amputation.   POSTOPERATIVE DIAGNOSIS:  Left ring finger traumatic amputation.    PROCEDURE:  Revision amputation through DIP joint, left ring finger.   ANESTHESIA: MAC with digital block.   SURGEON:  Dr. Rudene Christians  DESCRIPTION OF PROCEDURE: The patient was brought to the Operating Room, and after adequate sedation was given, a digital block was given with a total of 20 mL of 0.5% Sensorcaine along with additional local over the middle phalanx. This was done after prepping and draping, and appropriate patient identification and timeout procedures. Penrose drain was used as a tourniquet at the base of the finger. The posterior skin was excised to eliminate the nail bed and germinal matrix. The distal phalanx was then removed with use of rongeur, dissecting and releasing flexor and extensor tendon attachments as well as collateral ligaments. When this had been completed, additional devitalized skin was debrided with sharp dissection with a scalpel to get flaps that could be approximated over the articular cartilage of the middle phalanx. A 3-0 nylon suture was then placed with a corner-type suture to bring the 2 flaps over the top, tying it posteriorly on the dorsum of the finger. This gave good approximation. Additional simple interrupted sutures were then placed. Following this, a dressing with Xeroform, 4 x 4's and KLING dressing applied, with removal of the Penrose drain as a tourniquet. The patient was sent to the recovery room in stable condition.   ESTIMATED BLOOD LOSS:  Minimal.   COMPLICATIONS:  None.   SPECIMEN:  None.    ____________________________ Laurene Footman, MD mjm:mr D: 06/08/2012 19:19:06 ET T: 06/08/2012 20:23:49 ET JOB#: [id number]  cc: Laurene Footman, MD, <Dictator> Laurene Footman MD ELECTRONICALLY SIGNED  06/09/2012 12:51

## 2015-06-27 ENCOUNTER — Other Ambulatory Visit: Payer: Self-pay | Admitting: Orthopedic Surgery

## 2015-06-27 DIAGNOSIS — R9389 Abnormal findings on diagnostic imaging of other specified body structures: Secondary | ICD-10-CM

## 2015-07-05 ENCOUNTER — Inpatient Hospital Stay (HOSPITAL_COMMUNITY)
Admission: RE | Admit: 2015-07-05 | Discharge: 2015-07-05 | Disposition: A | Discharge status: Home / Self Care | Payer: PRIVATE HEALTH INSURANCE | Source: Ambulatory Visit

## 2015-07-05 NOTE — Pre-Procedure Instructions (Signed)
    Scott York  07/05/2015     Your procedure is scheduled on Monday, July 10.  Report to [facility] Admitting at 10:30  A.M.              Your surgery or procedure is scheduled for 8:30 AM   Call this number if you have problems the morning of surgery: [phone]                   For any other questions, please call [phone], Monday - Friday 8 AM - 4 PM.  Remember:  Do not eat food or drink liquids after midnight Sunday, July 9.  Take these medicines the morning of surgery with A SIP OF WATER: nebivolol (BYSTOLIC).               Take if needed: Tramadol (Ultram).                   Monday, July 3rd: STOP Aspirin, Aspirin Products, Vitamins, Herbal Mediations (Omega 3).  Do not take any Ibuprofen (Advil), or Naproxen (Aleve).   Do not wear jewelry, make-up or nail polish.  Do not wear lotions, powders, or perfumes.     Men may shave face and neck.  Do not bring valuables to the hospital.  [facility] is not responsible for any belongings or valuables.  Contacts, dentures or bridgework may not be worn into surgery.  Leave your suitcase in the car.  After surgery it may be brought to your room.  For patients admitted to the hospital, discharge time will be determined by your treatment team.   Special instructions:  [place]- Preparing For Surgery   Please read over the following fact sheets that you were given: [facility]- Preparing For Surgery and Patient Instructions for Mupirocin Application and Incentive Spirometry.

## 2015-07-09 ENCOUNTER — Ambulatory Visit (HOSPITAL_COMMUNITY)
Admission: RE | Admit: 2015-07-09 | Discharge: 2015-07-09 | Disposition: A | Discharge status: Home / Self Care | Payer: Worker's Compensation | Source: Ambulatory Visit | Attending: Orthopedic Surgery | Admitting: Orthopedic Surgery

## 2015-07-09 ENCOUNTER — Encounter (HOSPITAL_COMMUNITY): Payer: Self-pay

## 2015-07-09 ENCOUNTER — Other Ambulatory Visit: Payer: Self-pay

## 2015-07-09 ENCOUNTER — Encounter (HOSPITAL_COMMUNITY)
Admission: RE | Admit: 2015-07-09 | Discharge: 2015-07-09 | Disposition: A | Discharge status: Home / Self Care | Payer: Worker's Compensation | Source: Ambulatory Visit | Attending: Orthopedic Surgery | Admitting: Orthopedic Surgery

## 2015-07-09 DIAGNOSIS — M1712 Unilateral primary osteoarthritis, left knee: Secondary | ICD-10-CM | POA: Insufficient documentation

## 2015-07-09 DIAGNOSIS — E785 Hyperlipidemia, unspecified: Secondary | ICD-10-CM | POA: Diagnosis not present

## 2015-07-09 DIAGNOSIS — I10 Essential (primary) hypertension: Secondary | ICD-10-CM | POA: Diagnosis not present

## 2015-07-09 DIAGNOSIS — Z01818 Encounter for other preprocedural examination: Secondary | ICD-10-CM | POA: Insufficient documentation

## 2015-07-09 DIAGNOSIS — Z87891 Personal history of nicotine dependence: Secondary | ICD-10-CM | POA: Diagnosis not present

## 2015-07-09 DIAGNOSIS — Z7982 Long term (current) use of aspirin: Secondary | ICD-10-CM | POA: Insufficient documentation

## 2015-07-09 DIAGNOSIS — Z79899 Other long term (current) drug therapy: Secondary | ICD-10-CM | POA: Insufficient documentation

## 2015-07-09 DIAGNOSIS — Z01810 Encounter for preprocedural cardiovascular examination: Secondary | ICD-10-CM

## 2015-07-09 DIAGNOSIS — R918 Other nonspecific abnormal finding of lung field: Secondary | ICD-10-CM | POA: Insufficient documentation

## 2015-07-09 DIAGNOSIS — Z0183 Encounter for blood typing: Secondary | ICD-10-CM | POA: Insufficient documentation

## 2015-07-09 DIAGNOSIS — R001 Bradycardia, unspecified: Secondary | ICD-10-CM | POA: Insufficient documentation

## 2015-07-09 DIAGNOSIS — Z01812 Encounter for preprocedural laboratory examination: Secondary | ICD-10-CM | POA: Diagnosis not present

## 2015-07-09 HISTORY — DX: Hyperlipidemia, unspecified: E78.5

## 2015-07-09 LAB — COMPREHENSIVE METABOLIC PANEL
ALK PHOS: 72 U/L (ref 38–126)
ALT: 34 U/L (ref 17–63)
AST: 36 U/L (ref 15–41)
Albumin: 3.9 g/dL (ref 3.5–5.0)
Anion gap: 9 (ref 5–15)
BILIRUBIN TOTAL: 0.5 mg/dL (ref 0.3–1.2)
BUN: 17 mg/dL (ref 6–20)
CO2: 22 mmol/L (ref 22–32)
CREATININE: 0.83 mg/dL (ref 0.61–1.24)
Calcium: 9.4 mg/dL (ref 8.9–10.3)
Chloride: 108 mmol/L (ref 101–111)
GFR calc Af Amer: >60 mL/min (ref >60)
GLUCOSE: 92 mg/dL (ref 65–99)
Potassium: 3.8 mmol/L (ref 3.5–5.1)
Sodium: 139 mmol/L (ref 135–145)
TOTAL PROTEIN: 6.5 g/dL (ref 6.5–8.1)

## 2015-07-09 LAB — CBC WITH DIFFERENTIAL/PLATELET
BASOS ABS: 0 10*3/uL (ref 0.0–0.1)
Basophils Relative: 0 %
Eosinophils Absolute: 0.1 10*3/uL (ref 0.0–0.7)
Eosinophils Relative: 1 %
HEMATOCRIT: 43.8 % (ref 39.0–52.0)
HEMOGLOBIN: 14.1 g/dL (ref 13.0–17.0)
LYMPHS PCT: 30 %
Lymphs Abs: 2.2 10*3/uL (ref 0.7–4.0)
MCH: 28.5 pg (ref 26.0–34.0)
MCHC: 32.2 g/dL (ref 30.0–36.0)
MCV: 88.5 fL (ref 78.0–100.0)
MONO ABS: 0.5 10*3/uL (ref 0.1–1.0)
Monocytes Relative: 7 %
NEUTROS ABS: 4.5 10*3/uL (ref 1.7–7.7)
NEUTROS PCT: 62 %
Platelets: 266 10*3/uL (ref 150–400)
RBC: 4.95 MIL/uL (ref 4.22–5.81)
RDW: 13.2 % (ref 11.5–15.5)
WBC: 7.3 10*3/uL (ref 4.0–10.5)

## 2015-07-09 LAB — SURGICAL PCR SCREEN
MRSA, PCR: NEGATIVE
Staphylococcus aureus: NEGATIVE

## 2015-07-09 LAB — URINALYSIS, ROUTINE W REFLEX MICROSCOPIC
Bilirubin Urine: NEGATIVE
Glucose, UA: NEGATIVE mg/dL
KETONES UR: NEGATIVE mg/dL
LEUKOCYTES UA: NEGATIVE
NITRITE: NEGATIVE
PH: 6 (ref 5.0–8.0)
Protein, ur: NEGATIVE mg/dL
SPECIFIC GRAVITY, URINE: 1.026 (ref 1.005–1.030)

## 2015-07-09 LAB — URINE MICROSCOPIC-ADD ON

## 2015-07-09 LAB — APTT: APTT: 29 s (ref 24–37)

## 2015-07-09 LAB — PROTIME-INR
INR: 1.05 (ref 0.00–1.49)
PROTHROMBIN TIME: 13.9 s (ref 11.6–15.2)

## 2015-07-09 LAB — TYPE AND SCREEN
ABO/RH(D): O POS
Antibody Screen: NEGATIVE

## 2015-07-09 LAB — ABO/RH: ABO/RH(D): O POS

## 2015-07-09 NOTE — Pre-Procedure Instructions (Signed)
WARNER BARLING  07/09/2015     Your procedure is scheduled on Monday, July 16, 2015 at 12:30 PM.  Report to [facility] Admitting at 10:30 AM.              Call this number if you have problems the morning of surgery: [phone]              For any other questions, please call [phone], Monday - Friday 8 AM - 4 PM.    Remember:  Do not eat food or drink liquids after midnight Sunday, July 9.  Take these medicines the morning of surgery with A SIP OF WATER: nebivolol (BYSTOLIC), Tramadol (Ultram) if needed                   STOP taking any vitamins, herbal Medications/supplements, NSAIDs, Ibuprofen, Advil, Motrin, Aleve, Fish Oil/Omega 3, etc today   Do not wear jewelry.  Do not wear lotions, powders, or cologne.    Men may shave face and neck.  Do not bring valuables to the hospital.  [facility] is not responsible for any belongings or valuables.  Contacts, dentures or bridgework may not be worn into surgery.  Leave your suitcase in the car.  After surgery it may be brought to your room.  For patients admitted to the hospital, discharge time will be determined by your treatment team.   Special instructions:  Shower using CHG soap the night before and the morning of your surgery  Please read over the following fact sheets that you were given: [facility]- Preparing For Surgery and Patient Instructions for Mupirocin Application and Incentive Spirometry.

## 2015-07-11 NOTE — Progress Notes (Signed)
Anesthesia Chart Review:  Pt is a 50 year old male scheduled for L total knee arthroplasty on 07/16/2015 with Dorna Leitz, MD.   PCP is Aron Baba, MD.  PMH includes:  HTN, hyperlipidemia. Former smoker. BMI 46. S/p appendectomy 04/20/11  Medications include: ASA, lipitor, hctz, nebivolol  Preoperative labs reviewed.    Chest x-ray 07/09/15 reviewed. Questionable nodular density projected over the right lung base, possibly nipple shadow. Repeat PA lateral chest x-ray with nipple markers suggested. Notified Elmyra Ricks in Dr. Berenice Primas' office of need for repeat CXR.   EKG 07/09/15: sinus bradycardia (59 bpm)  If no changes, I anticipate pt can proceed with surgery as scheduled.   Willeen Cass, FNP-BC [facility] Stay [facility]/Anesthesiology Phone: [phone] 07/11/2015 1:08 PM

## 2015-07-13 MED ORDER — DEXTROSE 5 % IV SOLN
3.0000 g | INTRAVENOUS | Status: AC
  Administered 2015-07-16: 3 g via INTRAVENOUS
  Filled 2015-07-13: qty 3000

## 2015-07-13 MED ORDER — BUPIVACAINE LIPOSOME 1.3 % IJ SUSP
20.0000 mL | INTRAMUSCULAR | Status: AC
  Administered 2015-07-16: 20 mL
  Filled 2015-07-13: qty 20

## 2015-07-16 ENCOUNTER — Encounter (HOSPITAL_COMMUNITY)
Admission: RE | Disposition: A | Discharge status: Home / Self Care | Payer: Self-pay | Source: Ambulatory Visit | Attending: Orthopedic Surgery

## 2015-07-16 ENCOUNTER — Inpatient Hospital Stay (HOSPITAL_COMMUNITY): Payer: Worker's Compensation | Admitting: Certified Registered Nurse Anesthetist

## 2015-07-16 ENCOUNTER — Inpatient Hospital Stay (HOSPITAL_COMMUNITY)
Admission: RE | Admit: 2015-07-16 | Discharge: 2015-07-17 | DRG: 470 | Disposition: A | Discharge status: Home / Self Care | Payer: Worker's Compensation | Source: Ambulatory Visit | Attending: Orthopedic Surgery | Admitting: Orthopedic Surgery

## 2015-07-16 ENCOUNTER — Inpatient Hospital Stay (HOSPITAL_COMMUNITY): Payer: Worker's Compensation | Admitting: Emergency Medicine

## 2015-07-16 ENCOUNTER — Encounter (HOSPITAL_COMMUNITY): Payer: Self-pay | Admitting: *Deleted

## 2015-07-16 DIAGNOSIS — Z6841 Body Mass Index (BMI) 40.0 and over, adult: Secondary | ICD-10-CM | POA: Diagnosis not present

## 2015-07-16 DIAGNOSIS — M1712 Unilateral primary osteoarthritis, left knee: Secondary | ICD-10-CM | POA: Diagnosis present

## 2015-07-16 DIAGNOSIS — I10 Essential (primary) hypertension: Secondary | ICD-10-CM | POA: Diagnosis present

## 2015-07-16 DIAGNOSIS — Z7982 Long term (current) use of aspirin: Secondary | ICD-10-CM | POA: Diagnosis not present

## 2015-07-16 DIAGNOSIS — Z79899 Other long term (current) drug therapy: Secondary | ICD-10-CM | POA: Diagnosis not present

## 2015-07-16 DIAGNOSIS — E785 Hyperlipidemia, unspecified: Secondary | ICD-10-CM | POA: Diagnosis present

## 2015-07-16 DIAGNOSIS — Z87891 Personal history of nicotine dependence: Secondary | ICD-10-CM | POA: Diagnosis not present

## 2015-07-16 HISTORY — PX: TOTAL KNEE ARTHROPLASTY: SHX125

## 2015-07-16 SURGERY — ARTHROPLASTY, KNEE, TOTAL
Anesthesia: Spinal | Site: Knee | Laterality: Left

## 2015-07-16 MED ORDER — ATORVASTATIN CALCIUM 20 MG PO TABS
20.0000 mg | ORAL_TABLET | Freq: Every day | ORAL | Status: DC
  Administered 2015-07-16: 20 mg via ORAL
  Filled 2015-07-16: qty 1

## 2015-07-16 MED ORDER — ONDANSETRON HCL 4 MG/2ML IJ SOLN: 4.0000 mg | Freq: Four times a day (QID) | INTRAMUSCULAR | Status: DC | PRN

## 2015-07-16 MED ORDER — ATORVASTATIN CALCIUM 20 MG PO TABS: 20.0000 mg | ORAL_TABLET | Freq: Every day | ORAL | Status: DC

## 2015-07-16 MED ORDER — PROPOFOL 500 MG/50ML IV EMUL
INTRAVENOUS | Status: DC | PRN
  Administered 2015-07-16: 60 ug/kg/min via INTRAVENOUS

## 2015-07-16 MED ORDER — HYDROMORPHONE HCL 1 MG/ML IJ SOLN
INTRAMUSCULAR | Status: AC
  Filled 2015-07-16: qty 1

## 2015-07-16 MED ORDER — TRANEXAMIC ACID 1000 MG/10ML IV SOLN
1000.0000 mg | INTRAVENOUS | Status: AC
  Administered 2015-07-16: 1000 mg via INTRAVENOUS
  Filled 2015-07-16: qty 10

## 2015-07-16 MED ORDER — PROPOFOL 10 MG/ML IV BOLUS
INTRAVENOUS | Status: DC | PRN
  Administered 2015-07-16: 20 mg via INTRAVENOUS

## 2015-07-16 MED ORDER — MIDAZOLAM HCL 5 MG/5ML IJ SOLN
INTRAMUSCULAR | Status: DC | PRN
  Administered 2015-07-16 (×2): 1 mg via INTRAVENOUS

## 2015-07-16 MED ORDER — METHOCARBAMOL 1000 MG/10ML IJ SOLN
500.0000 mg | Freq: Four times a day (QID) | INTRAVENOUS | Status: DC | PRN
  Filled 2015-07-16: qty 5

## 2015-07-16 MED ORDER — METHOCARBAMOL 500 MG PO TABS
ORAL_TABLET | ORAL | Status: AC
  Filled 2015-07-16: qty 1

## 2015-07-16 MED ORDER — ACETAMINOPHEN 325 MG PO TABS: 650.0000 mg | ORAL_TABLET | Freq: Four times a day (QID) | ORAL | Status: DC | PRN

## 2015-07-16 MED ORDER — PHENYLEPHRINE 40 MCG/ML (10ML) SYRINGE FOR IV PUSH (FOR BLOOD PRESSURE SUPPORT)
PREFILLED_SYRINGE | INTRAVENOUS | Status: AC
  Filled 2015-07-16: qty 10

## 2015-07-16 MED ORDER — MAGNESIUM CITRATE PO SOLN: 1.0000 | Freq: Once | ORAL | Status: DC | PRN

## 2015-07-16 MED ORDER — NEBIVOLOL HCL 5 MG PO TABS: 10.0000 mg | ORAL_TABLET | Freq: Every day | ORAL | Status: DC

## 2015-07-16 MED ORDER — ACETAMINOPHEN 650 MG RE SUPP: 650.0000 mg | Freq: Four times a day (QID) | RECTAL | Status: DC | PRN

## 2015-07-16 MED ORDER — BUPIVACAINE HCL (PF) 0.5 % IJ SOLN
INTRAMUSCULAR | Status: DC | PRN
  Administered 2015-07-16: 30 mL

## 2015-07-16 MED ORDER — KETOROLAC TROMETHAMINE 15 MG/ML IJ SOLN
15.0000 mg | Freq: Three times a day (TID) | INTRAMUSCULAR | Status: DC
  Administered 2015-07-16 – 2015-07-17 (×3): 15 mg via INTRAVENOUS
  Filled 2015-07-16 (×2): qty 1

## 2015-07-16 MED ORDER — LIDOCAINE 2% (20 MG/ML) 5 ML SYRINGE
INTRAMUSCULAR | Status: AC
  Filled 2015-07-16: qty 5

## 2015-07-16 MED ORDER — PROPOFOL 500 MG/50ML IV EMUL
INTRAVENOUS | Status: AC
  Filled 2015-07-16: qty 50

## 2015-07-16 MED ORDER — OXYCODONE-ACETAMINOPHEN 5-325 MG PO TABS: 1.0000 | ORAL_TABLET | ORAL | Status: DC | PRN

## 2015-07-16 MED ORDER — BUPIVACAINE HCL (PF) 0.5 % IJ SOLN
INTRAMUSCULAR | Status: AC
  Filled 2015-07-16: qty 30

## 2015-07-16 MED ORDER — BISACODYL 5 MG PO TBEC: 5.0000 mg | DELAYED_RELEASE_TABLET | Freq: Every day | ORAL | Status: DC | PRN

## 2015-07-16 MED ORDER — MIDAZOLAM HCL 2 MG/2ML IJ SOLN
INTRAMUSCULAR | Status: AC
  Filled 2015-07-16: qty 2

## 2015-07-16 MED ORDER — PHENYLEPHRINE HCL 10 MG/ML IJ SOLN
INTRAMUSCULAR | Status: DC | PRN
  Administered 2015-07-16 (×3): 80 ug via INTRAVENOUS
  Administered 2015-07-16: 120 ug via INTRAVENOUS
  Administered 2015-07-16 (×2): 80 ug via INTRAVENOUS

## 2015-07-16 MED ORDER — POLYETHYLENE GLYCOL 3350 17 G PO PACK: 17.0000 g | PACK | Freq: Every day | ORAL | Status: DC | PRN

## 2015-07-16 MED ORDER — SODIUM CHLORIDE 0.9 % IJ SOLN
INTRAMUSCULAR | Status: DC | PRN
  Administered 2015-07-16: 20 mL

## 2015-07-16 MED ORDER — 0.9 % SODIUM CHLORIDE (POUR BTL) OPTIME
TOPICAL | Status: DC | PRN
  Administered 2015-07-16: 1000 mL

## 2015-07-16 MED ORDER — ASPIRIN EC 325 MG PO TBEC: 325.0000 mg | DELAYED_RELEASE_TABLET | Freq: Two times a day (BID) | ORAL | Status: DC

## 2015-07-16 MED ORDER — ONDANSETRON HCL 4 MG/2ML IJ SOLN: 4.0000 mg | Freq: Once | INTRAMUSCULAR | Status: DC | PRN

## 2015-07-16 MED ORDER — ASPIRIN EC 325 MG PO TBEC
325.0000 mg | DELAYED_RELEASE_TABLET | Freq: Two times a day (BID) | ORAL | Status: DC
  Administered 2015-07-16 – 2015-07-17 (×2): 325 mg via ORAL
  Filled 2015-07-16 (×2): qty 1

## 2015-07-16 MED ORDER — TRANEXAMIC ACID 1000 MG/10ML IV SOLN
1000.0000 mg | Freq: Once | INTRAVENOUS | Status: AC
  Administered 2015-07-16: 1000 mg via INTRAVENOUS
  Filled 2015-07-16: qty 10

## 2015-07-16 MED ORDER — NEBIVOLOL HCL 5 MG PO TABS
10.0000 mg | ORAL_TABLET | Freq: Every day | ORAL | Status: DC
  Administered 2015-07-17: 10 mg via ORAL
  Filled 2015-07-16: qty 2

## 2015-07-16 MED ORDER — DOCUSATE SODIUM 100 MG PO CAPS
100.0000 mg | ORAL_CAPSULE | Freq: Two times a day (BID) | ORAL | Status: DC
  Administered 2015-07-16 – 2015-07-17 (×2): 100 mg via ORAL
  Filled 2015-07-16 (×2): qty 1

## 2015-07-16 MED ORDER — DIPHENHYDRAMINE HCL 12.5 MG/5ML PO ELIX: 12.5000 mg | ORAL_SOLUTION | ORAL | Status: DC | PRN

## 2015-07-16 MED ORDER — SODIUM CHLORIDE 0.9 % IV SOLN
INTRAVENOUS | Status: DC
  Administered 2015-07-16: 18:00:00 via INTRAVENOUS

## 2015-07-16 MED ORDER — ONDANSETRON HCL 4 MG PO TABS: 4.0000 mg | ORAL_TABLET | Freq: Four times a day (QID) | ORAL | Status: DC | PRN

## 2015-07-16 MED ORDER — KETOROLAC TROMETHAMINE 15 MG/ML IJ SOLN
INTRAMUSCULAR | Status: AC
  Filled 2015-07-16: qty 1

## 2015-07-16 MED ORDER — EPHEDRINE SULFATE 50 MG/ML IJ SOLN
INTRAMUSCULAR | Status: DC | PRN
  Administered 2015-07-16 (×3): 10 mg via INTRAVENOUS
  Administered 2015-07-16: 5 mg via INTRAVENOUS
  Administered 2015-07-16: 10 mg via INTRAVENOUS

## 2015-07-16 MED ORDER — HYDROCHLOROTHIAZIDE 25 MG PO TABS: 25.0000 mg | ORAL_TABLET | Freq: Every day | ORAL | Status: DC

## 2015-07-16 MED ORDER — ALUM & MAG HYDROXIDE-SIMETH 200-200-20 MG/5ML PO SUSP: 30.0000 mL | ORAL | Status: DC | PRN

## 2015-07-16 MED ORDER — CEFAZOLIN SODIUM-DEXTROSE 2-4 GM/100ML-% IV SOLN
2.0000 g | Freq: Four times a day (QID) | INTRAVENOUS | Status: AC
  Administered 2015-07-16 – 2015-07-17 (×2): 2 g via INTRAVENOUS
  Filled 2015-07-16 (×2): qty 100

## 2015-07-16 MED ORDER — HYDROCHLOROTHIAZIDE 25 MG PO TABS
25.0000 mg | ORAL_TABLET | Freq: Every day | ORAL | Status: DC
  Administered 2015-07-17: 25 mg via ORAL
  Filled 2015-07-16: qty 1

## 2015-07-16 MED ORDER — DEXAMETHASONE SODIUM PHOSPHATE 10 MG/ML IJ SOLN
10.0000 mg | Freq: Two times a day (BID) | INTRAMUSCULAR | Status: AC
  Administered 2015-07-16 – 2015-07-17 (×2): 10 mg via INTRAVENOUS
  Filled 2015-07-16 (×2): qty 1

## 2015-07-16 MED ORDER — FENTANYL CITRATE (PF) 100 MCG/2ML IJ SOLN
INTRAMUSCULAR | Status: AC
  Filled 2015-07-16: qty 2

## 2015-07-16 MED ORDER — ZOLPIDEM TARTRATE 5 MG PO TABS: 5.0000 mg | ORAL_TABLET | Freq: Every evening | ORAL | Status: DC | PRN

## 2015-07-16 MED ORDER — EPHEDRINE 5 MG/ML INJ
INTRAVENOUS | Status: AC
  Filled 2015-07-16: qty 10

## 2015-07-16 MED ORDER — FENTANYL CITRATE (PF) 100 MCG/2ML IJ SOLN
INTRAMUSCULAR | Status: DC | PRN
  Administered 2015-07-16: 50 ug via INTRAVENOUS

## 2015-07-16 MED ORDER — LACTATED RINGERS IV SOLN
INTRAVENOUS | Status: DC
  Administered 2015-07-16 (×3): via INTRAVENOUS

## 2015-07-16 MED ORDER — CHLORHEXIDINE GLUCONATE 4 % EX LIQD: 60.0000 mL | Freq: Once | CUTANEOUS | Status: DC

## 2015-07-16 MED ORDER — METHOCARBAMOL 750 MG PO TABS: 750.0000 mg | ORAL_TABLET | Freq: Three times a day (TID) | ORAL | Status: DC | PRN

## 2015-07-16 MED ORDER — HYDROMORPHONE HCL 1 MG/ML IJ SOLN
0.5000 mg | INTRAMUSCULAR | Status: DC | PRN
  Administered 2015-07-16 (×2): 0.5 mg via INTRAVENOUS

## 2015-07-16 MED ORDER — OXYCODONE HCL 5 MG PO TABS
5.0000 mg | ORAL_TABLET | ORAL | Status: DC | PRN
  Administered 2015-07-16 – 2015-07-17 (×6): 10 mg via ORAL
  Filled 2015-07-16 (×5): qty 2

## 2015-07-16 MED ORDER — METHOCARBAMOL 500 MG PO TABS
500.0000 mg | ORAL_TABLET | Freq: Four times a day (QID) | ORAL | Status: DC | PRN
  Administered 2015-07-16 – 2015-07-17 (×4): 500 mg via ORAL
  Filled 2015-07-16 (×4): qty 1

## 2015-07-16 MED ORDER — OXYCODONE HCL 5 MG PO TABS
ORAL_TABLET | ORAL | Status: AC
  Filled 2015-07-16: qty 2

## 2015-07-16 MED ORDER — FENTANYL CITRATE (PF) 250 MCG/5ML IJ SOLN
INTRAMUSCULAR | Status: AC
  Filled 2015-07-16: qty 5

## 2015-07-16 SURGICAL SUPPLY — 73 items
APL SKNCLS STERI-STRIP NONHPOA (GAUZE/BANDAGES/DRESSINGS) ×1
BANDAGE ESMARK 6X9 LF (GAUZE/BANDAGES/DRESSINGS) ×1 IMPLANT
BENZOIN TINCTURE PRP APPL 2/3 (GAUZE/BANDAGES/DRESSINGS) ×3 IMPLANT
BLADE SAGITTAL 25.0X1.19X90 (BLADE) ×2 IMPLANT
BLADE SAGITTAL 25.0X1.19X90MM (BLADE) ×1
BLADE SAW SAG 90X13X1.27 (BLADE) ×3 IMPLANT
BNDG CMPR 9X6 STRL LF SNTH (GAUZE/BANDAGES/DRESSINGS) ×1
BNDG ESMARK 6X9 LF (GAUZE/BANDAGES/DRESSINGS) ×3
BOWL SMART MIX CTS (DISPOSABLE) ×3 IMPLANT
CAP KNEE TOTAL 3 SIGMA ×2 IMPLANT
CEMENT HV SMART SET (Cement) ×6 IMPLANT
CLOSURE WOUND 1/2 X4 (GAUZE/BANDAGES/DRESSINGS) ×1
COVER SURGICAL LIGHT HANDLE (MISCELLANEOUS) ×3 IMPLANT
CUFF TOURNIQUET SINGLE 34IN LL (TOURNIQUET CUFF) ×3 IMPLANT
DRAPE EXTREMITY T 121X128X90 (DRAPE) ×3 IMPLANT
DRAPE IMP U-DRAPE 54X76 (DRAPES) ×3 IMPLANT
DRAPE U-SHAPE 47X51 STRL (DRAPES) ×3 IMPLANT
DRSG AQUACEL AG ADV 3.5X10 (GAUZE/BANDAGES/DRESSINGS) ×2 IMPLANT
DRSG MEPILEX BORDER 4X12 (GAUZE/BANDAGES/DRESSINGS) ×1 IMPLANT
DRSG PAD ABDOMINAL 8X10 ST (GAUZE/BANDAGES/DRESSINGS) ×1 IMPLANT
DURAPREP 26ML APPLICATOR (WOUND CARE) ×3 IMPLANT
ELECT REM PT RETURN 9FT ADLT (ELECTROSURGICAL) ×3
ELECTRODE REM PT RTRN 9FT ADLT (ELECTROSURGICAL) ×1 IMPLANT
EVACUATOR 1/8 PVC DRAIN (DRAIN) ×1 IMPLANT
FACESHIELD WRAPAROUND (MASK) ×3 IMPLANT
FACESHIELD WRAPAROUND OR TEAM (MASK) ×1 IMPLANT
FLUID NSS /IRRIG 3000 ML XXX (IV SOLUTION) ×2 IMPLANT
GAUZE SPONGE 4X4 12PLY STRL (GAUZE/BANDAGES/DRESSINGS) ×1 IMPLANT
GLOVE BIOGEL PI IND STRL 6.5 (GLOVE) IMPLANT
GLOVE BIOGEL PI IND STRL 7.0 (GLOVE) IMPLANT
GLOVE BIOGEL PI IND STRL 8 (GLOVE) ×2 IMPLANT
GLOVE BIOGEL PI INDICATOR 6.5 (GLOVE) ×4
GLOVE BIOGEL PI INDICATOR 7.0 (GLOVE) ×6
GLOVE BIOGEL PI INDICATOR 8 (GLOVE) ×4
GLOVE ECLIPSE 7.5 STRL STRAW (GLOVE) ×6 IMPLANT
GLOVE SURG SS PI 7.0 STRL IVOR (GLOVE) ×2 IMPLANT
GOWN STRL REUS W/ TWL LRG LVL3 (GOWN DISPOSABLE) ×1 IMPLANT
GOWN STRL REUS W/ TWL XL LVL3 (GOWN DISPOSABLE) ×2 IMPLANT
GOWN STRL REUS W/TWL LRG LVL3 (GOWN DISPOSABLE) ×3
GOWN STRL REUS W/TWL XL LVL3 (GOWN DISPOSABLE) ×6
HANDPIECE INTERPULSE COAX TIP (DISPOSABLE) ×3
HOOD PEEL AWAY FACE SHEILD DIS (HOOD) ×7 IMPLANT
IMMOBILIZER KNEE 20 (SOFTGOODS) IMPLANT
IMMOBILIZER KNEE 22 (SOFTGOODS) ×2 IMPLANT
IMMOBILIZER KNEE 22 UNIV (SOFTGOODS) ×1 IMPLANT
IV CATH 22GX1 FEP (IV SOLUTION) ×2 IMPLANT
KIT BASIN OR (CUSTOM PROCEDURE TRAY) ×3 IMPLANT
KIT ROOM TURNOVER OR (KITS) ×3 IMPLANT
MANIFOLD NEPTUNE II (INSTRUMENTS) ×3 IMPLANT
NDL SPNL 22GX3.5 QUINCKE BK (NEEDLE) ×1 IMPLANT
NEEDLE SPNL 22GX3.5 QUINCKE BK (NEEDLE) IMPLANT
NS IRRIG 1000ML POUR BTL (IV SOLUTION) ×3 IMPLANT
PACK TOTAL JOINT (CUSTOM PROCEDURE TRAY) ×3 IMPLANT
PACK UNIVERSAL I (CUSTOM PROCEDURE TRAY) ×3 IMPLANT
PAD ABD 8X10 STRL (GAUZE/BANDAGES/DRESSINGS) ×2 IMPLANT
PAD ARMBOARD 7.5X6 YLW CONV (MISCELLANEOUS) ×6 IMPLANT
PAD CAST 4YDX4 CTTN HI CHSV (CAST SUPPLIES) ×1 IMPLANT
PADDING CAST COTTON 4X4 STRL (CAST SUPPLIES)
SET HNDPC FAN SPRY TIP SCT (DISPOSABLE) ×1 IMPLANT
STAPLER VISISTAT 35W (STAPLE) IMPLANT
STRIP CLOSURE SKIN 1/2X4 (GAUZE/BANDAGES/DRESSINGS) ×3 IMPLANT
SUCTION FRAZIER HANDLE 10FR (MISCELLANEOUS) ×2
SUCTION TUBE FRAZIER 10FR DISP (MISCELLANEOUS) ×1 IMPLANT
SUT MNCRL AB 3-0 PS2 18 (SUTURE) ×2 IMPLANT
SUT VIC AB 0 CTB1 27 (SUTURE) ×6 IMPLANT
SUT VIC AB 1 CT1 27 (SUTURE) ×6
SUT VIC AB 1 CT1 27XBRD ANBCTR (SUTURE) ×2 IMPLANT
SUT VIC AB 2-0 CTB1 (SUTURE) ×6 IMPLANT
SYR 50ML LL SCALE MARK (SYRINGE) ×5 IMPLANT
TOWEL OR 17X24 6PK STRL BLUE (TOWEL DISPOSABLE) ×3 IMPLANT
TOWEL OR 17X26 10 PK STRL BLUE (TOWEL DISPOSABLE) ×3 IMPLANT
TRAY CATH 16FR W/PLASTIC CATH (SET/KITS/TRAYS/PACK) ×2 IMPLANT
WRAP KNEE MAXI GEL POST OP (GAUZE/BANDAGES/DRESSINGS) ×3 IMPLANT

## 2015-07-16 NOTE — H&P (Signed)
TOTAL KNEE ADMISSION H&P  Patient is being admitted for left total knee arthroplasty.  Subjective:  Chief Complaint:left knee pain.  HPI: Scott York, 50 y.o. male, has a history of pain and functional disability in the left knee due to arthritis and has failed non-surgical conservative treatments for greater than 12 weeks to includeNSAID's and/or analgesics, corticosteriod injections, viscosupplementation injections, use of assistive devices, weight reduction as appropriate and activity modification.  Onset of symptoms was abrupt, starting 1 years ago with gradually worsening course since that time. The patient noted prior procedures on the knee to include  arthroscopy and menisectomy on the left knee(s).  Patient currently rates pain in the left knee(s) at 8 out of 10 with activity. Patient has night pain, worsening of pain with activity and weight bearing, pain that interferes with activities of daily living, pain with passive range of motion, crepitus and joint swelling.  Patient has evidence of subchondral cysts, subchondral sclerosis, joint subluxation and joint space narrowing by imaging studies. This patient has had failure of all reasonable conservative care. There is no active infection.  There are no active problems to display for this patient.  Past Medical History  Diagnosis Date  . Hypertension   . Cholelithiasis   . Obesity   . DDD (degenerative disc disease), lumbar     L5-S1  . UTI (lower urinary tract infection)   . Hyperlipidemia     Past Surgical History  Procedure Laterality Date  . Tonsillectomy    . Appendectomy  04/20/2011    Procedure: APPENDECTOMY;  Surgeon: Rolm Bookbinder, MD;  Location: [facility] OR;  Service: General;  Laterality: N/A;  Opened '@1855'$ .  . Fracture surgery Right     Clavicle  . Nose surgery    . Wisdom tooth extraction    . Knee arthroscopy w/ meniscal repair Left     Prescriptions prior to admission  Medication Sig Dispense Refill Last  Dose  . aspirin 81 MG chewable tablet Chew 243 mg by mouth daily.   Past Week at Unknown time  . atorvastatin (LIPITOR) 20 MG tablet Take 20 mg by mouth daily.  3 Past Week at Unknown time  . hydrochlorothiazide (HYDRODIURIL) 25 MG tablet Take 25 mg by mouth daily.   Past Week at Unknown time  . Multiple Vitamin (MULITIVITAMIN WITH MINERALS) TABS Take 1 tablet by mouth daily.   Past Week at Unknown time  . nebivolol (BYSTOLIC) 10 MG tablet Take 10 mg by mouth daily.   07/16/2015 at 0930  . OMEGA 3 1200 MG CAPS Take 1,200 mg by mouth daily.    Past Week at Unknown time  . traMADol (ULTRAM) 50 MG tablet Take 50 mg by mouth every 6 (six) hours as needed for moderate pain.   5 Past Week at Unknown time  . cyclobenzaprine (FLEXERIL) 10 MG tablet Take 10 mg by mouth 2 (two) times daily as needed for muscle spasms.   5 More than a month at Unknown time   Allergies  Allergen Reactions  . No Known Allergies     Social History  Substance Use Topics  . Smoking status: Former Smoker    Types: Cigars    Quit date: 04/30/2011  . Smokeless tobacco: Never Used  . Alcohol Use: No    Family History  Problem Relation Age of Onset  . Heart disease Paternal Grandfather      ROS ROS: I have reviewed the patient's review of systems thoroughly and there are no positive responses  as relates to the HPI. Objective:  Physical Exam  Vital signs in last 24 hours: Temp:  [98.8 F (37.1 C)] 98.8 F (37.1 C) (07/10 1044) Pulse Rate:  [98] 98 (07/10 1044) Resp:  [18] 18 (07/10 1044) BP: (136)/(65) 136/65 mmHg (07/10 1044) SpO2:  [100 %] 100 % (07/10 1044) Weight:  [126.1 kg (278 lb)] 126.1 kg (278 lb) (07/10 1044) Well-developed well-nourished patient in no acute distress. Alert and oriented x3 HEENT:within normal limits Cardiac: Regular rate and rhythm Pulmonary: Lungs clear to auscultation Abdomen: Soft and nontender.  Normal active bowel sounds  Musculoskeletal: (Left knee: Painful range of motion.   No instability.  No effusion.  Mild pain to range of motion. Labs: Recent Results (from the past 2160 hour(s))  Surgical pcr screen     Status: None   Collection Time: 07/09/15  2:12 PM  Result Value Ref Range   MRSA, PCR NEGATIVE NEGATIVE   Staphylococcus aureus NEGATIVE NEGATIVE    Comment:        The Xpert SA Assay (FDA approved for NASAL specimens in patients over 66 years of age), is one component of a comprehensive surveillance program.  Test performance has been validated by [facility] for patients greater than or equal to 43 year old. It is not intended to diagnose infection nor to guide or monitor treatment.   APTT     Status: None   Collection Time: 07/09/15  2:13 PM  Result Value Ref Range   aPTT 29 24 - 37 seconds  CBC WITH DIFFERENTIAL     Status: None   Collection Time: 07/09/15  2:13 PM  Result Value Ref Range   WBC 7.3 4.0 - 10.5 K/uL   RBC 4.95 4.22 - 5.81 MIL/uL   Hemoglobin 14.1 13.0 - 17.0 g/dL   HCT 43.8 39.0 - 52.0 %   MCV 88.5 78.0 - 100.0 fL   MCH 28.5 26.0 - 34.0 pg   MCHC 32.2 30.0 - 36.0 g/dL   RDW 13.2 11.5 - 15.5 %   Platelets 266 150 - 400 K/uL   Neutrophils Relative % 62 %   Neutro Abs 4.5 1.7 - 7.7 K/uL   Lymphocytes Relative 30 %   Lymphs Abs 2.2 0.7 - 4.0 K/uL   Monocytes Relative 7 %   Monocytes Absolute 0.5 0.1 - 1.0 K/uL   Eosinophils Relative 1 %   Eosinophils Absolute 0.1 0.0 - 0.7 K/uL   Basophils Relative 0 %   Basophils Absolute 0.0 0.0 - 0.1 K/uL  Comprehensive metabolic panel     Status: None   Collection Time: 07/09/15  2:13 PM  Result Value Ref Range   Sodium 139 135 - 145 mmol/L   Potassium 3.8 3.5 - 5.1 mmol/L   Chloride 108 101 - 111 mmol/L   CO2 22 22 - 32 mmol/L   Glucose, Bld 92 65 - 99 mg/dL   BUN 17 6 - 20 mg/dL   Creatinine, Ser 0.83 0.61 - 1.24 mg/dL   Calcium 9.4 8.9 - 10.3 mg/dL   Total Protein 6.5 6.5 - 8.1 g/dL   Albumin 3.9 3.5 - 5.0 g/dL   AST 36 15 - 41 U/L   ALT 34 17 - 63 U/L   Alkaline  Phosphatase 72 38 - 126 U/L   Total Bilirubin 0.5 0.3 - 1.2 mg/dL   GFR calc non Af Amer >60 >60 mL/min   GFR calc Af Amer >60 >60 mL/min    Comment: (NOTE) The eGFR  has been calculated using the CKD EPI equation. This calculation has not been validated in all clinical situations. eGFR's persistently <60 mL/min signify possible Chronic Kidney Disease.    Anion gap 9 5 - 15  Protime-INR     Status: None   Collection Time: 07/09/15  2:13 PM  Result Value Ref Range   Prothrombin Time 13.9 11.6 - 15.2 seconds   INR 1.05 0.00 - 1.49  Urinalysis, Routine w reflex microscopic (not at [facility])     Status: Abnormal   Collection Time: 07/09/15  2:13 PM  Result Value Ref Range   Color, Urine YELLOW YELLOW   APPearance CLEAR CLEAR   Specific Gravity, Urine 1.026 1.005 - 1.030   pH 6.0 5.0 - 8.0   Glucose, UA NEGATIVE NEGATIVE mg/dL   Hgb urine dipstick TRACE (A) NEGATIVE   Bilirubin Urine NEGATIVE NEGATIVE   Ketones, ur NEGATIVE NEGATIVE mg/dL   Protein, ur NEGATIVE NEGATIVE mg/dL   Nitrite NEGATIVE NEGATIVE   Leukocytes, UA NEGATIVE NEGATIVE  Urine microscopic-add on     Status: Abnormal   Collection Time: 07/09/15  2:13 PM  Result Value Ref Range   Squamous Epithelial / LPF 0-5 (A) NONE SEEN   WBC, UA 0-5 0 - 5 WBC/hpf   RBC / HPF 0-5 0 - 5 RBC/hpf   Bacteria, UA FEW (A) NONE SEEN  Type and screen Order type and screen if day of surgery is less than 15 days from draw of preadmission visit or order morning of surgery if day of surgery is greater than 6 days from preadmission visit.     Status: None   Collection Time: 07/09/15  2:31 PM  Result Value Ref Range   ABO/RH(D) O POS    Antibody Screen NEG    Sample Expiration 07/23/2015    Extend sample reason NO TRANSFUSIONS OR PREGNANCY IN THE PAST 3 MONTHS   ABO/Rh     Status: None   Collection Time: 07/09/15  2:31 PM  Result Value Ref Range   ABO/RH(D) O POS     Estimated body mass index is 46.26 kg/(m^2) as calculated from the  following:   Height as of this encounter: '5\' 5"'$  (1.651 m).   Weight as of this encounter: 126.1 kg (278 lb).   Imaging Review Plain radiographs demonstrate severe degenerative joint disease of the left knee(s). The overall alignment ismild varus. The bone quality appears to be good for age and reported activity level.  Assessment/Plan:  End stage arthritis, left knee   The patient history, physical examination, clinical judgment of the provider and imaging studies are consistent with end stage degenerative joint disease of the left knee(s) and total knee arthroplasty is deemed medically necessary. The treatment options including medical management, injection therapy arthroscopy and arthroplasty were discussed at length. The risks and benefits of total knee arthroplasty were presented and reviewed. The risks due to aseptic loosening, infection, stiffness, patella tracking problems, thromboembolic complications and other imponderables were discussed. The patient acknowledged the explanation, agreed to proceed with the plan and consent was signed. Patient is being admitted for inpatient treatment for surgery, pain control, PT, OT, prophylactic antibiotics, VTE prophylaxis, progressive ambulation and ADL's and discharge planning. The patient is planning to be discharged home with home health services

## 2015-07-16 NOTE — Progress Notes (Signed)
Orthopedic Tech Progress Note Patient Details:  Scott York Oct 01, 1965 [id number]  CPM Left Knee CPM Left Knee: On Left Knee Flexion (Degrees): 90 Left Knee Extension (Degrees): 0 Additional Comments: Foot roll   Scott York 07/16/2015, 3:51 PM

## 2015-07-16 NOTE — Discharge Instructions (Signed)

## 2015-07-16 NOTE — Anesthesia Procedure Notes (Signed)
Spinal Patient location during procedure: OR Start time: 07/16/2015 12:35 PM End time: 07/16/2015 12:40 PM Staffing Anesthesiologist: Kate Sable Performed by: anesthesiologist  Preanesthetic Checklist Completed: patient identified, site marked, surgical consent, pre-op evaluation, timeout performed, IV checked, risks and benefits discussed and monitors and equipment checked Spinal Block Patient position: left lateral decubitus Prep: ChloraPrep Patient monitoring: heart rate, cardiac monitor, continuous pulse ox and blood pressure Approach: midline Location: L3-4 Injection technique: single-shot Needle Needle type: Quincke  Needle gauge: 22 G Needle length: 9 cm Needle insertion depth: 9 cm Assessment Sensory level: T10 Additional Notes Pt accepts procedure w/ risks. 12mg  0.75% Marcaine w/ epi w/ mild difficulty. Pt tolerated well. GES

## 2015-07-16 NOTE — Transfer of Care (Signed)
Immediate Anesthesia Transfer of Care Note  Patient: Scott York  Procedure(s) Performed: Procedure(s): TOTAL KNEE ARTHROPLASTY (Left)  Patient Location: PACU  Anesthesia Type:MAC and Spinal  Level of Consciousness: awake, alert  and oriented  Airway & Oxygen Therapy: Patient Spontanous Breathing  Post-op Assessment: Report given to RN, Post -op Vital signs reviewed and stable and Patient moving all extremities  Post vital signs: Reviewed and stable  Last Vitals:  Filed Vitals:   07/16/15 1044  BP: 136/65  Pulse: 98  Temp: 37.1 C  Resp: 18    Last Pain:  Filed Vitals:   07/16/15 1046  PainSc: 5       Patients Stated Pain Goal: 4 ([id number])  Complications: No apparent anesthesia complications

## 2015-07-16 NOTE — Brief Op Note (Signed)
07/16/2015  4:28 PM  PATIENT:  Scott York  50 y.o. male  PRE-OPERATIVE DIAGNOSIS:  OSTEOARTHRITIS LEFT KNEE  POST-OPERATIVE DIAGNOSIS:  OSTEOARTHRITIS LEFT KNEE  PROCEDURE:  Procedure(s): TOTAL KNEE ARTHROPLASTY (Left)  SURGEON:  Surgeon(s) and Role:    * Dorna Leitz, MD - Primary  PHYSICIAN ASSISTANT:   ASSISTANTS: bethune   ANESTHESIA:   general  EBL:  Total I/O In: 1500 [I.V.:1500] Out: 450 [Urine:400; Blood:50]  BLOOD ADMINISTERED:none  DRAINS: none   LOCAL MEDICATIONS USED:  MARCAINE     SPECIMEN:  No Specimen  DISPOSITION OF SPECIMEN:  N/A  COUNTS:  YES  TOURNIQUET:   Total Tourniquet Time Documented: Thigh (Left) - 59 minutes Total: Thigh (Left) - 59 minutes   DICTATION: .Other Dictation: Dictation Number [id number]  PLAN OF CARE: Admit to inpatient   PATIENT DISPOSITION:  PACU - hemodynamically stable.   Delay start of Pharmacological VTE agent (>24hrs) due to surgical blood loss or risk of bleeding: no

## 2015-07-16 NOTE — Progress Notes (Signed)
Scott York is a 50 y.o. male patient admitted from PACU awake, alert - oriented  X 4 - no acute distress noted.  VSS - Blood pressure 99/68, pulse 63, temperature 97.9 F (36.6 C), temperature source Oral, resp. rate 20, height 5\' 5"  (1.651 m), weight 126.1 kg (278 lb), SpO2 97 %.    IV in place, occlusive dsg intact without redness.  Orientation to room, and floor completed with information packet given to patient/family.  Patient declined safety video at this time.  Admission INP armband ID verified with patient/family, and in place.   SR up x 2, fall assessment complete, with patient and family able to verbalize understanding of risk associated with falls, and verbalized understanding to call nsg before up out of bed.  Call light within reach, patient able to voice, and demonstrate understanding.  Dressing to left knee clean dry and intact. Patient in CPM machine.   PACU RN Velta Addison stated patient tolerating clear liquids.   Will cont to eval and treat per MD orders.  Janalyn Shy, RN 07/16/2015 5:39 PM

## 2015-07-16 NOTE — Anesthesia Preprocedure Evaluation (Addendum)
Anesthesia Evaluation  Patient identified by MRN, date of birth, ID band Patient awake    Reviewed: Allergy & Precautions, NPO status , Patient's Chart, lab work & pertinent test results, reviewed documented beta blocker date and time   Airway Mallampati: I  TM Distance: >3 FB     Dental  (+) Teeth Intact, Dental Advisory Given   Pulmonary former smoker,    Pulmonary exam normal        Cardiovascular hypertension, Normal cardiovascular exam     Neuro/Psych    GI/Hepatic   Endo/Other  Morbid obesity  Renal/GU      Musculoskeletal  (+) Arthritis ,   Abdominal   Peds  Hematology   Anesthesia Other Findings   Reproductive/Obstetrics                            Anesthesia Physical Anesthesia Plan  ASA: II  Anesthesia Plan: Spinal   Post-op Pain Management:    Induction: Intravenous  Airway Management Planned: Mask  Additional Equipment:   Intra-op Plan:   Post-operative Plan:   Informed Consent: I have reviewed the patients History and Physical, chart, labs and discussed the procedure including the risks, benefits and alternatives for the proposed anesthesia with the patient or authorized representative who has indicated his/her understanding and acceptance.     Plan Discussed with: CRNA, Anesthesiologist and Surgeon  Anesthesia Plan Comments:         Anesthesia Quick Evaluation

## 2015-07-17 ENCOUNTER — Encounter (HOSPITAL_COMMUNITY): Payer: Self-pay | Admitting: Orthopedic Surgery

## 2015-07-17 LAB — BASIC METABOLIC PANEL
ANION GAP: 9 (ref 5–15)
BUN: 12 mg/dL (ref 6–20)
CHLORIDE: 105 mmol/L (ref 101–111)
CO2: 23 mmol/L (ref 22–32)
Calcium: 9.2 mg/dL (ref 8.9–10.3)
Creatinine, Ser: 0.92 mg/dL (ref 0.61–1.24)
GFR calc non Af Amer: >60 mL/min (ref >60)
Glucose, Bld: 151 mg/dL — ABNORMAL HIGH (ref 65–99)
Potassium: 3.9 mmol/L (ref 3.5–5.1)
Sodium: 137 mmol/L (ref 135–145)

## 2015-07-17 LAB — CBC
HEMATOCRIT: 41.8 % (ref 39.0–52.0)
HEMOGLOBIN: 13.6 g/dL (ref 13.0–17.0)
MCH: 28.7 pg (ref 26.0–34.0)
MCHC: 32.5 g/dL (ref 30.0–36.0)
MCV: 88.2 fL (ref 78.0–100.0)
Platelets: 246 10*3/uL (ref 150–400)
RBC: 4.74 MIL/uL (ref 4.22–5.81)
RDW: 13.2 % (ref 11.5–15.5)
WBC: 9.5 10*3/uL (ref 4.0–10.5)

## 2015-07-17 MED ORDER — ASPIRIN 325 MG PO TBEC: 325.0000 mg | DELAYED_RELEASE_TABLET | Freq: Two times a day (BID) | ORAL | Status: DC

## 2015-07-17 NOTE — Progress Notes (Signed)
Subjective: 1 Day Post-Op Procedure(s) (LRB): TOTAL KNEE ARTHROPLASTY (Left) Patient reports pain as mild.    Objective: Vital signs in last 24 hours: Temp:  [97 F (36.1 C)-99.2 F (37.3 C)] 98.8 F (37.1 C) (07/11 0438) Pulse Rate:  [58-98] 77 (07/11 0438) Resp:  [14-21] 16 (07/11 0438) BP: (97-136)/(53-94) 132/57 mmHg (07/11 0438) SpO2:  [94 %-100 %] 94 % (07/11 0438) Weight:  [126.1 kg (278 lb)] 126.1 kg (278 lb) (07/10 1044)  Intake/Output from previous day: 07/10 0701 - 07/11 0700 In: 2645 [P.O.:900; I.V.:1745] Out: 1700 [Urine:1650; Blood:50] Intake/Output this shift:    No results for input(s): HGB in the last 72 hours. No results for input(s): WBC, RBC, HCT, PLT in the last 72 hours. No results for input(s): NA, K, CL, CO2, BUN, CREATININE, GLUCOSE, CALCIUM in the last 72 hours. No results for input(s): LABPT, INR in the last 72 hours.  Neurologically intact ABD soft Neurovascular intact Sensation intact distally Intact pulses distally No cellulitis present Compartment soft  Assessment/Plan: 1 Day Post-Op Procedure(s) (LRB): TOTAL KNEE ARTHROPLASTY (Left) Advance diet Up with therapy Discharge home with home health  Pamlea Finder L 07/17/2015, 8:06 AM

## 2015-07-17 NOTE — Anesthesia Postprocedure Evaluation (Signed)
Anesthesia Post Note  Patient: Scott York  Procedure(s) Performed: Procedure(s) (LRB): TOTAL KNEE ARTHROPLASTY (Left)  Patient location during evaluation: PACU Anesthesia Type: Spinal Level of consciousness: awake Pain management: pain level controlled Vital Signs Assessment: post-procedure vital signs reviewed and stable Respiratory status: spontaneous breathing Cardiovascular status: stable Postop Assessment: no signs of nausea or vomiting and spinal receding Anesthetic complications: no    Last Vitals:  Filed Vitals:   07/17/15 0021 07/17/15 0438  BP: 131/70 132/57  Pulse: 79 77  Temp: 37.3 C 37.1 C  Resp: 16 16    Last Pain:  Filed Vitals:   07/17/15 0709  PainSc: 5                  Ellasyn Swilling

## 2015-07-17 NOTE — Progress Notes (Signed)
PT Cancellation Note  Patient Details Name: Scott York MRN: [id number] DOB: 10-Oct-1965   Cancelled Treatment:    Reason Eval/Treat Not Completed: Patient declined, no reason specified. Pt stated that he has been OOB and ambulating around his room with his significant other's assistance and with knee immobilizer donned to L LE. Pt was dressed in his own clothes and awaiting d/c home. Pt with no additional questions or concerns. PT will f/u with pt as appropriate.   [place] 07/17/2015, 3:05 PM  Sherie Don, [place], DPT [phone]

## 2015-07-17 NOTE — Progress Notes (Signed)
Orthopedic Tech Progress Note Patient Details:  Scott York 1965/03/26 [id number]  Patient ID: Scott York, male   DOB: January 15, 1965, 50 y.o.   MRN: [id number] Applied cpm 0-90  Scott York 07/17/2015, 5:43 AM

## 2015-07-17 NOTE — Op Note (Signed)
NAME:  Scott York, Scott York NO.:  [id number]  MEDICAL RECORD NO.:  [id number]  LOCATION:  5N30C                        FACILITY:  [facility]  PHYSICIAN:  Alta Corning, M.D.   DATE OF BIRTH:  [birth year]  DATE OF PROCEDURE:  07/16/2015 DATE OF DISCHARGE:                              OPERATIVE REPORT   He is a 50 year old male.  PREOPERATIVE DIAGNOSIS:  End-stage degenerative joint disease, left knee with bone-on-bone change in the medial compartment and patellofemoral compartment.  POSTOPERATIVE DIAGNOSIS:  End-stage degenerative joint disease, left knee with bone-on-bone change in the medial compartment and patellofemoral compartment.  PROCEDURES:  Left total knee replacement with a Sigma system, size 3 femur, size 3 tibia, 12.5-mm bridging bearing, and a 38-mm all- polyethylene patella.  SURGEON:  Dorna Leitz, M.D.  ASSISTANT:  Gary Fleet, P.A.  ANESTHESIA:  General.  BRIEF HISTORY:  Scott York is a 50 year old male with a long history significant for complaints of left knee pain.  He had had previous arthroscopy, anti-inflammatory medication, injection therapy, and prolonged physical therapy.  He did initially reasonably well after arthroscopy, but continued to complain of pain.  Intraoperative x-ray images from his arthroscopy showed bone-on-bone change of the medial compartment.  After failure of all conservative care, he was taken to the operating room for left total knee replacement.  He was having night pain and light activity pain prior to surgery.  DESCRIPTION OF PROCEDURE:  The patient was taken to the operating room. After adequate anesthesia was obtained with general anesthetic, the patient was placed supine on the operating table.  The left leg was then prepped and draped in the usual sterile fashion.  Following this, the leg was exsanguinated.  Blood pressure tourniquet was inflated to 300 mmHg.  Following this, a midline incision was made in  the subcutaneous tissue and dissected down to the level of the extensor mechanism and a medial parapatellar arthrotomy was undertaken.  Following this, the synovium on the anterior aspect of the femur, medial and lateral meniscus, retropatellar fat pad, and anterior and posterior cruciates were all excised.  Following this, an intramedullary pilot hole was drilled in the femur and a 4-degree valgus inclination cut was made and 10 degrees of distal bone was resected.  Following this, attention was turned towards sizing the femur and sized to a 3.  We did anterior and posterior cuts, chamfers and box.  Following this, attention was turned to the tibia and the tibia was cut perpendicular to its long axis.  It was then sized to a 3.  It was drilled and keeled and trial components were then put in place into the tibia and the femur.  Attention was turned to the patella, it was cut down to a level of 13 mm.  Thirty- eight paddle was chosen and fit and then lugs were drilled into the patellar paddle.  Once this was done, the attention was turned towards putting the trial components and the knee was put through a range of motion.  Excellent stability and range of motion were achieved.  All trial components were then removed.  The knee was copiously and thoroughly lavaged, pulsatile lavage, irrigation and suctioned dry and  the final components were cemented into place.  A size 3 tibia, size 3 femur, 12.5-mm bridging bearing trial, and a 38 all-poly patella placed and held with a clamp.  All excess bone cement was removed and the cement was allowed to completely harden and once it was, tourniquet was let down.  All bleeding was controlled with electrocautery and attention was turned towards placement of the final 15 poly.  60 mL of 20 mL Exparel, 20 mL Marcaine, 20 mL saline was instilled throughout the capsular area circumferentially to control for postoperative pain control.  At this point, the  wound was irrigated thoroughly and suctioned dry.  The tourniquet had been let down previously.  The final poly placed and the medial parapatellar arthrotomy was closed with 1 Vicryl running, skin with 0 and 2-0 Vicryl, and 3-0 Monocryl subcuticular.  Benzoin and Steri-Strips were applied, sterile compressive dressing was applied, and the patient was taken to the recovery room where he was noted to be in satisfactory condition. Estimated blood loss for the procedure was minimal.     Alta Corning, M.D.     Corliss Skains  D:  07/16/2015  T:  07/17/2015  Job:  [id number]

## 2015-07-17 NOTE — Evaluation (Signed)
Physical Therapy Evaluation Patient Details Name: Scott York MRN: [id number] DOB: 1965/04/04 Today's Date: 07/17/2015   History of Present Illness  Pt is a 50 y/o male s/p TKR L LE secondary to OA. PMH including but not limited to HTN, obesity and previous R clavicle surgery.  Clinical Impression  Pt presented sitting upright in recliner with significant other present in room. He stated that he felt good and was ready to d/c home today. He required frequent verbal cues to be more cautious during ambulation, while turning and navigating around obstacles as he was impulsive. Pt required only min guard with all transfers, ambulation and stairs. PT recommending d/c to home with [facility] PT to continue to strengthen and increase ROM of L LE.    Follow Up Recommendations Home health PT    Equipment Recommendations  Rolling walker with 5" wheels    Recommendations for Other Services       Precautions / Restrictions Precautions Precautions: Fall Required Braces or Orthoses: Knee Immobilizer - Left Knee Immobilizer - Left: On when out of bed or walking Restrictions Weight Bearing Restrictions: Yes LLE Weight Bearing: Weight bearing as tolerated      Mobility  Bed Mobility               General bed mobility comments: Pt was OOB sitting up in recliner when PT entered room and returned to sitting upright in recliner at end of session.  Transfers Overall transfer level: Needs assistance Equipment used: Rolling walker (2 wheeled) Transfers: Sit to/from Stand Sit to Stand: Min guard         General transfer comment: Pt required verbal cueing for appropriate positioning of bilateral UEs to assist during transfer. Pt also required verbal reminder that he should have knee immobilizer donned to L LE prior to standing or walking.  Ambulation/Gait Ambulation/Gait assistance: Min guard Ambulation Distance (Feet): 300 Feet Assistive device: Rolling walker (2 wheeled) Gait  Pattern/deviations: Step-to pattern;Decreased step length - right;Decreased stance time - left;Decreased weight shift to left     General Gait Details: Pt required frequent verbal cues to be cautious with walking, turning and navigating around obstacles.  Stairs Stairs: Yes Stairs assistance: Min guard Stair Management: Two rails;Step to pattern Number of Stairs: 2 General stair comments: Pt required verbal cues to ascend with R LE leading and descend with L LE leading.  Wheelchair Mobility    Modified Rankin (Stroke Patients Only)       Balance Overall balance assessment: Needs assistance Sitting-balance support: No upper extremity supported Sitting balance-Leahy Scale: Fair     Standing balance support: Single extremity supported Standing balance-Leahy Scale: Poor                               Pertinent Vitals/Pain Pain Assessment: 0-10 Pain Score: 4  Pain Location: L knee and quad Pain Descriptors / Indicators: Discomfort Pain Intervention(s): Monitored during session;Ice applied    Home Living Family/patient expects to be discharged to:: Private residence Living Arrangements: Spouse/significant other Available Help at Discharge: Family Type of Home: House Home Access: Stairs to enter Entrance Stairs-Rails: Can reach both Entrance Stairs-Number of Steps: 2 Home Layout: One level        Prior Function Level of Independence: Independent               Hand Dominance        Extremity/Trunk Assessment   Upper Extremity Assessment: [facility]  for tasks assessed           Lower Extremity Assessment: LLE deficits/detail   LLE Deficits / Details: Pt with strength and ROM limitations secondary to post-op TKR. Sensation is grossly intact.     Communication   Communication: No difficulties  Cognition Arousal/Alertness: Awake/alert Behavior During Therapy: Impulsive (needed cueing to slow down) Overall Cognitive Status: Within  Functional Limits for tasks assessed                      General Comments      Exercises Total Joint Exercises Ankle Circles/Pumps: AROM;Strengthening;Left;10 reps;Seated Quad Sets: AROM;Strengthening;Left;10 reps Heel Slides: AROM;Strengthening;Left;10 reps Hip ABduction/ADduction: AROM;Strengthening;Left;10 reps Long Arc Quad: AROM;Strengthening;Left;10 reps Knee Flexion: AROM;Strengthening;Left;10 reps;Seated      Assessment/Plan    PT Assessment Patient needs continued PT services  PT Diagnosis Difficulty walking   PT Problem List Decreased strength;Decreased range of motion;Decreased activity tolerance;Decreased balance;Decreased mobility;Decreased knowledge of use of DME;Decreased safety awareness;Pain  PT Treatment Interventions DME instruction;Gait training;Stair training;Functional mobility training;Therapeutic activities;Therapeutic exercise;Balance training;Patient/family education   PT Goals (Current goals can be found in the Care Plan section) Acute Rehab PT Goals Patient Stated Goal: Pt would like to return home and return to PLOF without pain. PT Goal Formulation: With patient/family Time For Goal Achievement: 07/24/15 Potential to Achieve Goals: Good    Frequency 7X/week   Barriers to discharge        Co-evaluation               End of Session Equipment Utilized During Treatment: Gait belt;Left knee immobilizer Activity Tolerance: Patient tolerated treatment well Patient left: in chair;with call bell/phone within reach;with family/visitor present Nurse Communication: Mobility status         Time: [id number] PT Time Calculation (min) (ACUTE ONLY): 19 min   Charges:   PT Evaluation $PT Eval Moderate Complexity: 1 Procedure     PT G CodesClearnce Sorrel Darcy Barbara 07/17/2015, 11:52 AM  Sherie Don, PT, DPT [phone]

## 2015-07-27 NOTE — Discharge Summary (Signed)
Patient ID: Scott York MRN: [id number] DOB/AGE: 51-Nov-1967 51 y.o.  Admit date: 07/16/2015 Discharge date: 07/17/2015  Admission Diagnoses:  Principal Problem:   Primary osteoarthritis of left knee   Discharge Diagnoses:  Same  Past Medical History  Diagnosis Date  . Hypertension   . Cholelithiasis   . Obesity   . DDD (degenerative disc disease), lumbar     L5-S1  . UTI (lower urinary tract infection)   . Hyperlipidemia     Surgeries: Procedure(s):Left TOTAL KNEE ARTHROPLASTY on 07/16/2015    Discharged Condition: Improved  Hospital Course: DAMYN WEITZEL is an 50 y.o. male who was admitted 07/16/2015 for operative treatment ofPrimary osteoarthritis of left knee. Patient has severe unremitting pain that affects sleep, daily activities, and work/hobbies. After pre-op clearance the patient was taken to the operating room on 07/16/2015 and underwent  Procedure(s): Left TOTAL KNEE ARTHROPLASTY.    Patient was given perioperative antibiotics:  Anti-infectives    Start     Dose/Rate Route Frequency Ordered Stop   07/16/15 2200  ceFAZolin (ANCEF) IVPB 2g/100 mL premix     2 g 200 mL/hr over 30 Minutes Intravenous Every 6 hours 07/16/15 1739 07/17/15 0432   07/16/15 1200  ceFAZolin (ANCEF) 3 g in dextrose 5 % 50 mL IVPB     3 g 130 mL/hr over 30 Minutes Intravenous To ShortStay Surgical 07/13/15 0742 07/16/15 1227       Patient was given sequential compression devices, early ambulation, and chemoprophylaxis to prevent DVT.The patient progressed well with physical therapy. On the day of discharge he was afebrile, his vital signs are stable. He was ambulating with physical therapy. He was taking oral pain medication.  Patient benefited maximally from hospital stay and there were no complications.    Recent vital signs: See chart for details   Recent laboratory studies:  Recent Results (from the past 2160 hour(s))  Surgical pcr screen     Status: None   Collection  Time: 07/09/15  2:12 PM  Result Value Ref Range   MRSA, PCR NEGATIVE NEGATIVE   Staphylococcus aureus NEGATIVE NEGATIVE    Comment:        The Xpert SA Assay (FDA approved for NASAL specimens in patients over 60 years of age), is one component of a comprehensive surveillance program.  Test performance has been validated by [facility] for patients greater than or equal to 35 year old. It is not intended to diagnose infection nor to guide or monitor treatment.   APTT     Status: None   Collection Time: 07/09/15  2:13 PM  Result Value Ref Range   aPTT 29 24 - 37 seconds  CBC WITH DIFFERENTIAL     Status: None   Collection Time: 07/09/15  2:13 PM  Result Value Ref Range   WBC 7.3 4.0 - 10.5 K/uL   RBC 4.95 4.22 - 5.81 MIL/uL   Hemoglobin 14.1 13.0 - 17.0 g/dL   HCT 43.8 39.0 - 52.0 %   MCV 88.5 78.0 - 100.0 fL   MCH 28.5 26.0 - 34.0 pg   MCHC 32.2 30.0 - 36.0 g/dL   RDW 13.2 11.5 - 15.5 %   Platelets 266 150 - 400 K/uL   Neutrophils Relative % 62 %   Neutro Abs 4.5 1.7 - 7.7 K/uL   Lymphocytes Relative 30 %   Lymphs Abs 2.2 0.7 - 4.0 K/uL   Monocytes Relative 7 %   Monocytes Absolute 0.5 0.1 - 1.0 K/uL  Eosinophils Relative 1 %   Eosinophils Absolute 0.1 0.0 - 0.7 K/uL   Basophils Relative 0 %   Basophils Absolute 0.0 0.0 - 0.1 K/uL  Comprehensive metabolic panel     Status: None   Collection Time: 07/09/15  2:13 PM  Result Value Ref Range   Sodium 139 135 - 145 mmol/L   Potassium 3.8 3.5 - 5.1 mmol/L   Chloride 108 101 - 111 mmol/L   CO2 22 22 - 32 mmol/L   Glucose, Bld 92 65 - 99 mg/dL   BUN 17 6 - 20 mg/dL   Creatinine, Ser 0.83 0.61 - 1.24 mg/dL   Calcium 9.4 8.9 - 10.3 mg/dL   Total Protein 6.5 6.5 - 8.1 g/dL   Albumin 3.9 3.5 - 5.0 g/dL   AST 36 15 - 41 U/L   ALT 34 17 - 63 U/L   Alkaline Phosphatase 72 38 - 126 U/L   Total Bilirubin 0.5 0.3 - 1.2 mg/dL   GFR calc non Af Amer >60 >60 mL/min   GFR calc Af Amer >60 >60 mL/min    Comment: (NOTE) The  eGFR has been calculated using the CKD EPI equation. This calculation has not been validated in all clinical situations. eGFR's persistently <60 mL/min signify possible Chronic Kidney Disease.    Anion gap 9 5 - 15  Protime-INR     Status: None   Collection Time: 07/09/15  2:13 PM  Result Value Ref Range   Prothrombin Time 13.9 11.6 - 15.2 seconds   INR 1.05 0.00 - 1.49  Urinalysis, Routine w reflex microscopic (not at [facility])     Status: Abnormal   Collection Time: 07/09/15  2:13 PM  Result Value Ref Range   Color, Urine YELLOW YELLOW   APPearance CLEAR CLEAR   Specific Gravity, Urine 1.026 1.005 - 1.030   pH 6.0 5.0 - 8.0   Glucose, UA NEGATIVE NEGATIVE mg/dL   Hgb urine dipstick TRACE (A) NEGATIVE   Bilirubin Urine NEGATIVE NEGATIVE   Ketones, ur NEGATIVE NEGATIVE mg/dL   Protein, ur NEGATIVE NEGATIVE mg/dL   Nitrite NEGATIVE NEGATIVE   Leukocytes, UA NEGATIVE NEGATIVE  Urine microscopic-add on     Status: Abnormal   Collection Time: 07/09/15  2:13 PM  Result Value Ref Range   Squamous Epithelial / LPF 0-5 (A) NONE SEEN   WBC, UA 0-5 0 - 5 WBC/hpf   RBC / HPF 0-5 0 - 5 RBC/hpf   Bacteria, UA FEW (A) NONE SEEN  Type and screen Order type and screen if day of surgery is less than 15 days from draw of preadmission visit or order morning of surgery if day of surgery is greater than 6 days from preadmission visit.     Status: None   Collection Time: 07/09/15  2:31 PM  Result Value Ref Range   ABO/RH(D) O POS    Antibody Screen NEG    Sample Expiration 07/23/2015    Extend sample reason NO TRANSFUSIONS OR PREGNANCY IN THE PAST 3 MONTHS   ABO/Rh     Status: None   Collection Time: 07/09/15  2:31 PM  Result Value Ref Range   ABO/RH(D) O POS   CBC     Status: None   Collection Time: 07/17/15  8:25 AM  Result Value Ref Range   WBC 9.5 4.0 - 10.5 K/uL   RBC 4.74 4.22 - 5.81 MIL/uL   Hemoglobin 13.6 13.0 - 17.0 g/dL   HCT 41.8 39.0 - 52.0 %  MCV 88.2 78.0 - 100.0 fL   MCH  28.7 26.0 - 34.0 pg   MCHC 32.5 30.0 - 36.0 g/dL   RDW 13.2 11.5 - 15.5 %   Platelets 246 150 - 400 K/uL  Basic metabolic panel     Status: Abnormal   Collection Time: 07/17/15  8:25 AM  Result Value Ref Range   Sodium 137 135 - 145 mmol/L   Potassium 3.9 3.5 - 5.1 mmol/L   Chloride 105 101 - 111 mmol/L   CO2 23 22 - 32 mmol/L   Glucose, Bld 151 (H) 65 - 99 mg/dL   BUN 12 6 - 20 mg/dL   Creatinine, Ser 0.92 0.61 - 1.24 mg/dL   Calcium 9.2 8.9 - 10.3 mg/dL   GFR calc non Af Amer >60 >60 mL/min   GFR calc Af Amer >60 >60 mL/min    Comment: (NOTE) The eGFR has been calculated using the CKD EPI equation. This calculation has not been validated in all clinical situations. eGFR's persistently <60 mL/min signify possible Chronic Kidney Disease.    Anion gap 9 5 - 15     Discharge Medications:     Medication List    STOP taking these medications        aspirin 81 MG chewable tablet  Replaced by:  aspirin EC 325 MG tablet     cyclobenzaprine 10 MG tablet  Commonly known as:  FLEXERIL      TAKE these medications        aspirin EC 325 MG tablet  Take 1 tablet (325 mg total) by mouth 2 (two) times daily after a meal. Take x 1 month post op to decrease risk of blood clots.     aspirin 325 MG EC tablet  Take 1 tablet (325 mg total) by mouth 2 (two) times daily after a meal.     atorvastatin 20 MG tablet  Commonly known as:  LIPITOR  Take 20 mg by mouth daily.     hydrochlorothiazide 25 MG tablet  Commonly known as:  HYDRODIURIL  Take 25 mg by mouth daily.     methocarbamol 750 MG tablet  Commonly known as:  ROBAXIN-750  Take 1 tablet (750 mg total) by mouth every 8 (eight) hours as needed for muscle spasms.     multivitamin with minerals Tabs tablet  Take 1 tablet by mouth daily.     nebivolol 10 MG tablet  Commonly known as:  BYSTOLIC  Take 10 mg by mouth daily.     Omega 3 1200 MG Caps  Take 1,200 mg by mouth daily.     oxyCODONE-acetaminophen 5-325 MG tablet   Commonly known as:  PERCOCET/ROXICET  Take 1-2 tablets by mouth every 4 (four) hours as needed for severe pain.     traMADol 50 MG tablet  Commonly known as:  ULTRAM  Take 50 mg by mouth every 6 (six) hours as needed for moderate pain.        Diagnostic Studies: Dg Chest 2 View  07/09/2015  CLINICAL DATA:  Knee replacement.  Preoperative exam. EXAM: CHEST  2 VIEW COMPARISON:  08/31/2012. FINDINGS: Mediastinum and hilar structures stable. Stable anterior pleural thickening seen on lateral view unchanged from prior exam. Questionable nodular density possibly nipple shadow projected over right lung base. Repeat PA lateral chest x-ray with nipple markers suggested. Heart size normal. No pleural effusion or pneumothorax . Plate and screw fixation of the right clavicle is present. IMPRESSION: Questionable nodular density projected over the  right lung base, possibly nipple shadow. Repeat PA lateral chest x-ray with nipple markers suggested . Electronically Signed   By: Marcello Moores  Register   On: 07/09/2015 14:26    Disposition: 01-Home or Self Care        Follow-up Information    Follow up with GRAVES,JOHN L, MD. Schedule an appointment as soon as possible for a visit in 2 weeks.   Specialty:  Orthopedic Surgery   Contact information:   [place] [address] [phone]       Follow up with GRAVES,JOHN L, MD In 2 weeks.   Specialty:  Orthopedic Surgery   Contact information:   [place] [address] [phone]       Please follow up.   Why:  Someone from [employer] comp will contact you to arrange start date and time for therapy.       SignedErlene Senters 07/27/2015, 9:28 AM

## 2018-03-30 ENCOUNTER — Telehealth: Payer: Self-pay | Admitting: Neurology

## 2018-03-30 NOTE — Telephone Encounter (Signed)
I called pt, spent 9 minutes and 30 seconds on the phone with him. Pt has never had a sleep study but does endorse snoring.  Epworth Sleepiness Scale 0= would never doze 1= slight chance of dozing 2= moderate chance of dozing 3= high chance of dozing  Sitting and reading:1 Watching TV: 1 Sitting inactive in a public place (ex. Theater or meeting): 0 As a passenger in a car for an hour without a break: 3 Lying down to rest in the afternoon: 0 Sitting and talking to someone: 0 Sitting quietly after lunch (no alcohol): 0 In a car, while stopped in traffic: 0 Total: 5  FSS: 13  Med history, medications, and allergies were updated.  Please call pt at [phone] during pt's scheduled appt tomorrow. Pt verbalized understanding to be ready for this call.

## 2018-03-30 NOTE — Telephone Encounter (Signed)
Due to current COVID 19 pandemic, our office is severely reducing in office visits for at least the next 2 weeks, in order to minimize the risk to our patients and healthcare providers. Our staff will contact you for next steps.  Pt understands that although there may be some limitations with this type of visit, we will take all precautions to reduce any security or privacy concerns.  Pt understands that this will be treated like an in office visit and we will file with pt's insurance, and there may be a patient responsible charge related to this service.

## 2018-03-31 ENCOUNTER — Ambulatory Visit (INDEPENDENT_AMBULATORY_CARE_PROVIDER_SITE_OTHER): Payer: Commercial Managed Care - PPO | Admitting: Neurology

## 2018-03-31 ENCOUNTER — Other Ambulatory Visit: Payer: Self-pay

## 2018-03-31 DIAGNOSIS — Z9189 Other specified personal risk factors, not elsewhere classified: Secondary | ICD-10-CM | POA: Diagnosis not present

## 2018-03-31 DIAGNOSIS — R351 Nocturia: Secondary | ICD-10-CM

## 2018-03-31 DIAGNOSIS — R0683 Snoring: Secondary | ICD-10-CM

## 2018-03-31 DIAGNOSIS — Z6841 Body Mass Index (BMI) 40.0 and over, adult: Secondary | ICD-10-CM

## 2018-03-31 NOTE — Progress Notes (Signed)
Virtual Visit via Telephone Note on 03/31/2018  I connected with Scott York on 03/31/18 at  9:00 AM EDT by telephone and verified that I am speaking with the correct person using two identifiers.   I discussed the limitations, risks, security and privacy concerns of performing an evaluation and management service by telephone and the availability of in person appointments. I also discussed with the patient that there may be a patient responsible charge related to this service. The patient expressed understanding and agreed to proceed.   History of Present Illness:  Mr. Scott York is a 53 year old right-handed gentleman with an underlying medical history of hypertension, hyperlipidemia, cholelithiasis, lumbar degenerative disc disease, status post left total knee replacement, status post tonsillectomy, history of UTI and morbid obesity with a BMI of over 40, with whom I am conducting a new patient telephone visit today, in lieu of a face-to-face visit, for evaluation of his sleep disorder, in particular, concern for underlying obstructive sleep apnea. The patient is referred by Alvester Chou, NP. His Epworth sleepiness score is 5 out of 24, fatigue severity score is 13 out of 63. 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 He was advised to seek a sleep study based on a concern on the physical exam for high risk for sleep apnea, he reports that his neck circumference at the DOT physical was 17-1/2 inches. Since then, he has been trying to lose weight, in January 2020 his weight was recorded at 271.4 pounds at his primary care visit, he reports a current weight of 249 pounds. He estimates that currently as far as his own measure of neck size, his neck circumference is about 16-1/4 or 16-1/2 inches. He had a tonsillectomy as a child, is not aware of any family history of OSA. He is a nonsmoker, has  smoked the occasional cigar, quit drinking alcohol altogether in 2006 because when he drank alcohol he would drink too much she admits. He drinks caffeine in the form of coffee, up to 24 ounces per day, may need to drink more water he admits. He drinks soda but without caffeine typically. Bedtime is around 9, typically he is asleep by 10, he does watch TV in the bedroom but turns it off. His rise time is between 5:30 and 6. He has nocturia about twice per average night and denies morning headaches. His wife has voiced the occasional concern about pauses in his breathing and he does snore. He lives with his wife, has 3 biological children. In his household currently he has a step/adopted daughter, her fianc, and a grandchild also lives in the household. He has no back pain and is a restless sleeper. He has had no back surgeries, he does take tramadol twice daily for his pain and is also status post left total knee replacement.   Observations/Objective:  He is status post left total knee replacement, status post tonsillectomy, neck circumference is estimated to be 16.25 to 16.5 inches. He has lost weight, current self-reported weight is 249 pounds, was 271.4 pounds on 01/11/2018 at primary care office.  Assessment and Plan: In summary, Scott York is a very pleasant 53 y.o.-year old male  with an underlying medical history of hypertension, hyperlipidemia, cholelithiasis, lumbar degenerative disc disease, status post left total knee replacement, status post tonsillectomy, history of UTI and morbid obesity with a BMI of over 40, whose history (and  physical exam) are concerning for obstructive sleep apnea (OSA). I had a long chat with the patient about the diagnosis of OSA, its prognosis and treatment options. We talked about medical treatments, surgical interventions and non-pharmacological approaches. I explained in particular the risks and ramifications of untreated moderate to severe OSA. I recommended  the following at this time: home sleep test with disposable HST equipment.  I explained the sleep test procedure to the patient and also outlined possible treatment options of OSA, including the use of an autoPAP or CPAP machine. He indicated that he would be willing to use a CPAP if the need arises. I explained the importance of being compliant with PAP treatment, not only for insurance purposes, DOT purposes, but primarily to improve His symptoms, and for the patient's long term health benefit, including to reduce His cardiovascular risks. I answered all his questions today and the patient was in agreement. I plan to see him after the sleep study is completed and encouraged him to call with any interim questions, concerns, problems or updates.    Star Age, MD, PhD    Follow Up Instructions:  FU after HST.   I discussed the assessment and treatment plan with the patient. The patient was provided an opportunity to ask questions and all were answered. The patient agreed with the plan and demonstrated an understanding of the instructions.   The patient was advised to call back or seek an in-person evaluation if the symptoms worsen or if the condition fails to improve as anticipated.  I provided 31 minutes of non-face-to-face time during this encounter.   Star Age, MD

## 2018-03-31 NOTE — Patient Instructions (Signed)
Given over ph.

## 2018-04-09 ENCOUNTER — Ambulatory Visit (INDEPENDENT_AMBULATORY_CARE_PROVIDER_SITE_OTHER): Payer: Commercial Managed Care - PPO | Admitting: Neurology

## 2018-04-09 DIAGNOSIS — R351 Nocturia: Secondary | ICD-10-CM

## 2018-04-09 DIAGNOSIS — Z9189 Other specified personal risk factors, not elsewhere classified: Secondary | ICD-10-CM

## 2018-04-09 DIAGNOSIS — Z6841 Body Mass Index (BMI) 40.0 and over, adult: Secondary | ICD-10-CM

## 2018-04-09 DIAGNOSIS — G4733 Obstructive sleep apnea (adult) (pediatric): Secondary | ICD-10-CM | POA: Diagnosis not present

## 2018-04-09 DIAGNOSIS — R0683 Snoring: Secondary | ICD-10-CM

## 2018-04-22 ENCOUNTER — Telehealth: Payer: Self-pay

## 2018-04-22 NOTE — Progress Notes (Signed)
Patient referred by Alvester Chou, NP, seen virtually by me on 03/31/18, HST on 04/07/18.    Please call and notify the patient that the recent home sleep test showed obstructive sleep apnea in the moderate severe range. While I recommend treatment for this in the form CPAP, we are not yet bringing patients in for in lab testing for CPAP, due to the virus pandemic; therefore, I suggest we start him on a trial of autoPAP at home, which means, that we don't have to bring him in for a sleep study with CPAP, but will let him start using an autoPAP machine at home, through a DME company (of his choice, or as per insurance requirement). The DME representative will educate him on how to use the machine, how to put the mask on, etc. I have placed an order in the chart. Please send referral, talk to patient, send report to referring MD. We will need a FU in sleep clinic for 10 weeks post-PAP set up, please arrange that with me or one of our NPs. Thanks,   Star Age, MD, PhD [facility])

## 2018-04-22 NOTE — Procedures (Signed)
Patient Information     First Name: Scott Last Name: York ID: [id number]  Birth Date: March 27, 1965 Age: 53 Gender: Male  Insurer:  BMI: 41.5 (W=249 lb, H=5' 5'')  Neck Circ.: Address: [address] '' Ep[place]:  5 Mobile Phone:  Sleep Study Information    Study Date: Apr 07, 2018 S/H/A Version: [id number] / 4.1.1531 / 27  Comments 53 year old man with a history of hypertension, hyperlipidemia, cholelithiasis, lumbar degenerative disc disease, status post left total knee replacement, status post tonsillectomy, history of UTI and morbid obesity with a BMI of over 40, who presents for evaluation of his sleep disorder, in particular, concern for underlying obstructive sleep apnea. His Epworth sleepiness score is 5 out of 24, BMI of 41.5. He is a DOT driver. Neck circumference is about 16-1/4 or 16-1/2 in. Electronically Signed:    Summary & Diagnosis:    OSA  Recommendations:     This home sleep test demonstrates moderate obstructive sleep apnea with a total AHI of 23.8/hour and O2 nadir of 84%. Treatment with positive airway pressure - in the form of CPAP - is recommended. This will require, ideally, a full night CPAP titration study for proper treatment settings, O2 monitoring and mask fitting. Based on the severity of the sleep disordered breathing, an attended titration study is indicated. However, under the current circumstances (i.e. the COVID-19 pandemic), in order to ensure ongoing good care and for the safety of the patient, she will be advised to proceed with an autoPAP titration/trial at home. A proper titration study with CPAP may be helpful or needed down the road, when considered safe. Please note, that untreated obstructive sleep apnea may carry additional perioperative morbidity. Patients with significant obstructive sleep apnea should receive perioperative PAP therapy and the surgeons and particularly the anesthesiologist should be informed of the diagnosis and the severity of the sleep  disordered breathing. Patient will be reminded regarding compliance with the PAP machine and to be mindful of cleanliness with the equipment and timely with supply changes (i.e. changing filter, mask, hose, humidifier chamber on an ongoing basis as recommended, and cleaning parts that touch the face and nose daily, etc). The patient should be cautioned not to drive, work at heights, or operate dangerous or heavy equipment when tired or sleepy. Review and reiteration of good sleep hygiene measures should be pursued with any patient. Other causes of the patient's symptoms, including circadian rhythm disturbances, an underlying mood disorder, medication effect and/or an underlying medical problem cannot be ruled out based on this test. Clinical correlation is recommended. The patient and his referring provider will be notified of the test results. The patient will be seen in follow up in sleep clinic at [facility], either for a face-to-face or virtual visit, whichever feasible and recommended at the time.  I certify that I have reviewed the raw data recording prior to the issuance of this report in accordance with the standards of the American Academy of Sleep Medicine (AASM).  Star Age, MD, PhD [facility]) Diplomat, ABPN (Neurology and Sleep)        Sleep Summary  Oxygen Saturation Statistics   Start Study Time: End Study Time: Total Recording Time:  9:20:56 PM 5:04:31 AM 7 hrs, 51min  Total Sleep Time % REM of Sleep Time:  6 hrs, 8 min 20.9    Mean: 93 Minimum: 84 Maximum: 99  Mean of Desaturations Nadirs (%):   91  Oxygen Desatur. %: 4-9 10-20 >20 Total  Events Number  Total  99 100.0  0 0.0  0 0.0  99 100.0  Oxygen Saturation: <90 <=88 <85 <80 <70  Duration (minutes): Sleep % 3.0 1.3 0.8 0.3 0.0 0.0 0.0 0.0 0.0 0.0     Respiratory Indices      Total Events REM NREM All Night  pRDI:  147  pAHI:  144 ODI:  99  pAHIc:  12  % CSR: 0.0  18.0 17.2 10.9 0.8 26.0 25.6 17.8 2.3 24.3 23.8 16.4 2.0       Pulse Rate Statistics during Sleep (BPM)      Mean:  65 Minimum: 46 Maximum: 103    Indices are calculated using technically valid sleep time of  6 hrs, 2 min. pRDI/pAHI are calculated using oxi desaturations ? 3%   Body Position Statistics  Position Supine Prone Right Left Non-Supine  Sleep (min) 277.5 51.0 13.0 26.6 90.6  Sleep % 75.4 13.9 3.5 7.2 24.6  pRDI 26.9 28.3 4.6 0.0 16.6  pAHI 26.4 27.1 4.6 0.0 15.9  ODI 18.7 16.5 0.0 0.0 9.3     Snoring Statistics Snoring Level (dB) >40 >50 >60 >70 >80 >Threshold (45)  Sleep (min) 124.4 16.1 1.6 0.0 0.0 44.9  Sleep % 33.8 4.4 0.4 0.0 0.0 12.2    Mean: 42 dB Sleep Stages Chart                                    pAHI=23.8                                                 Mild              Moderate                    Severe                                                    5              15                    30  * Reference values are according to AASM guidelines

## 2018-04-22 NOTE — Telephone Encounter (Signed)
I called pt. I advised pt that Dr. Rexene Alberts reviewed their sleep study results and found that pt has moderate to severe osa. Dr. Rexene Alberts recommends that pt start an auto pap at home. I reviewed PAP compliance expectations with the pt. Pt is agreeable to starting an auto-PAP. I advised pt that an order will be sent to a DME, [facility], and [facility] will call the pt within about one week after they file with the pt's insurance. [facility] will show the pt how to use the machine, fit for masks, and troubleshoot the auto-PAP if needed. A follow up appt was made for insurance purposes with Dr. Rexene Alberts on 07/12/2018 at 8:30am. Pt verbalized understanding to arrive 15 minutes early and bring their auto-PAP. A letter with all of this information in it will be sent to the pt's mychart account as a reminder. Pt verbalized understanding of results. Pt had no questions at this time but was encouraged to call back if questions arise. I have sent the order to [facility] and have received confirmation that they have received the order.

## 2018-04-22 NOTE — Telephone Encounter (Signed)
-----   Message from Star Age, MD sent at 04/22/2018  8:27 AM EDT ----- Patient referred by Alvester Chou, NP, seen virtually by me on 03/31/18, HST on 04/07/18.    Please call and notify the patient that the recent home sleep test showed obstructive sleep apnea in the moderate severe range. While I recommend treatment for this in the form CPAP, we are not yet bringing patients in for in lab testing for CPAP, due to the virus pandemic; therefore, I suggest we start him on a trial of autoPAP at home, which means, that we don't have to bring him in for a sleep study with CPAP, but will let him start using an autoPAP machine at home, through a DME company (of his choice, or as per insurance requirement). The DME representative will educate him on how to use the machine, how to put the mask on, etc. I have placed an order in the chart. Please send referral, talk to patient, send report to referring MD. We will need a FU in sleep clinic for 10 weeks post-PAP set up, please arrange that with me or one of our NPs. Thanks,   Star Age, MD, PhD [facility])

## 2018-04-22 NOTE — Addendum Note (Signed)
Addended by: Star Age on: 04/22/2018 08:27 AM   Modules accepted: Orders

## 2018-06-19 IMAGING — CR DG CHEST 2V
2 series · 2 of 2 positions shown · non-contrast
Comparison: 08/31/2012.

CLINICAL DATA: Knee replacement.  Preoperative exam.

EXAM:
CHEST  2 VIEW

[w chest pa]
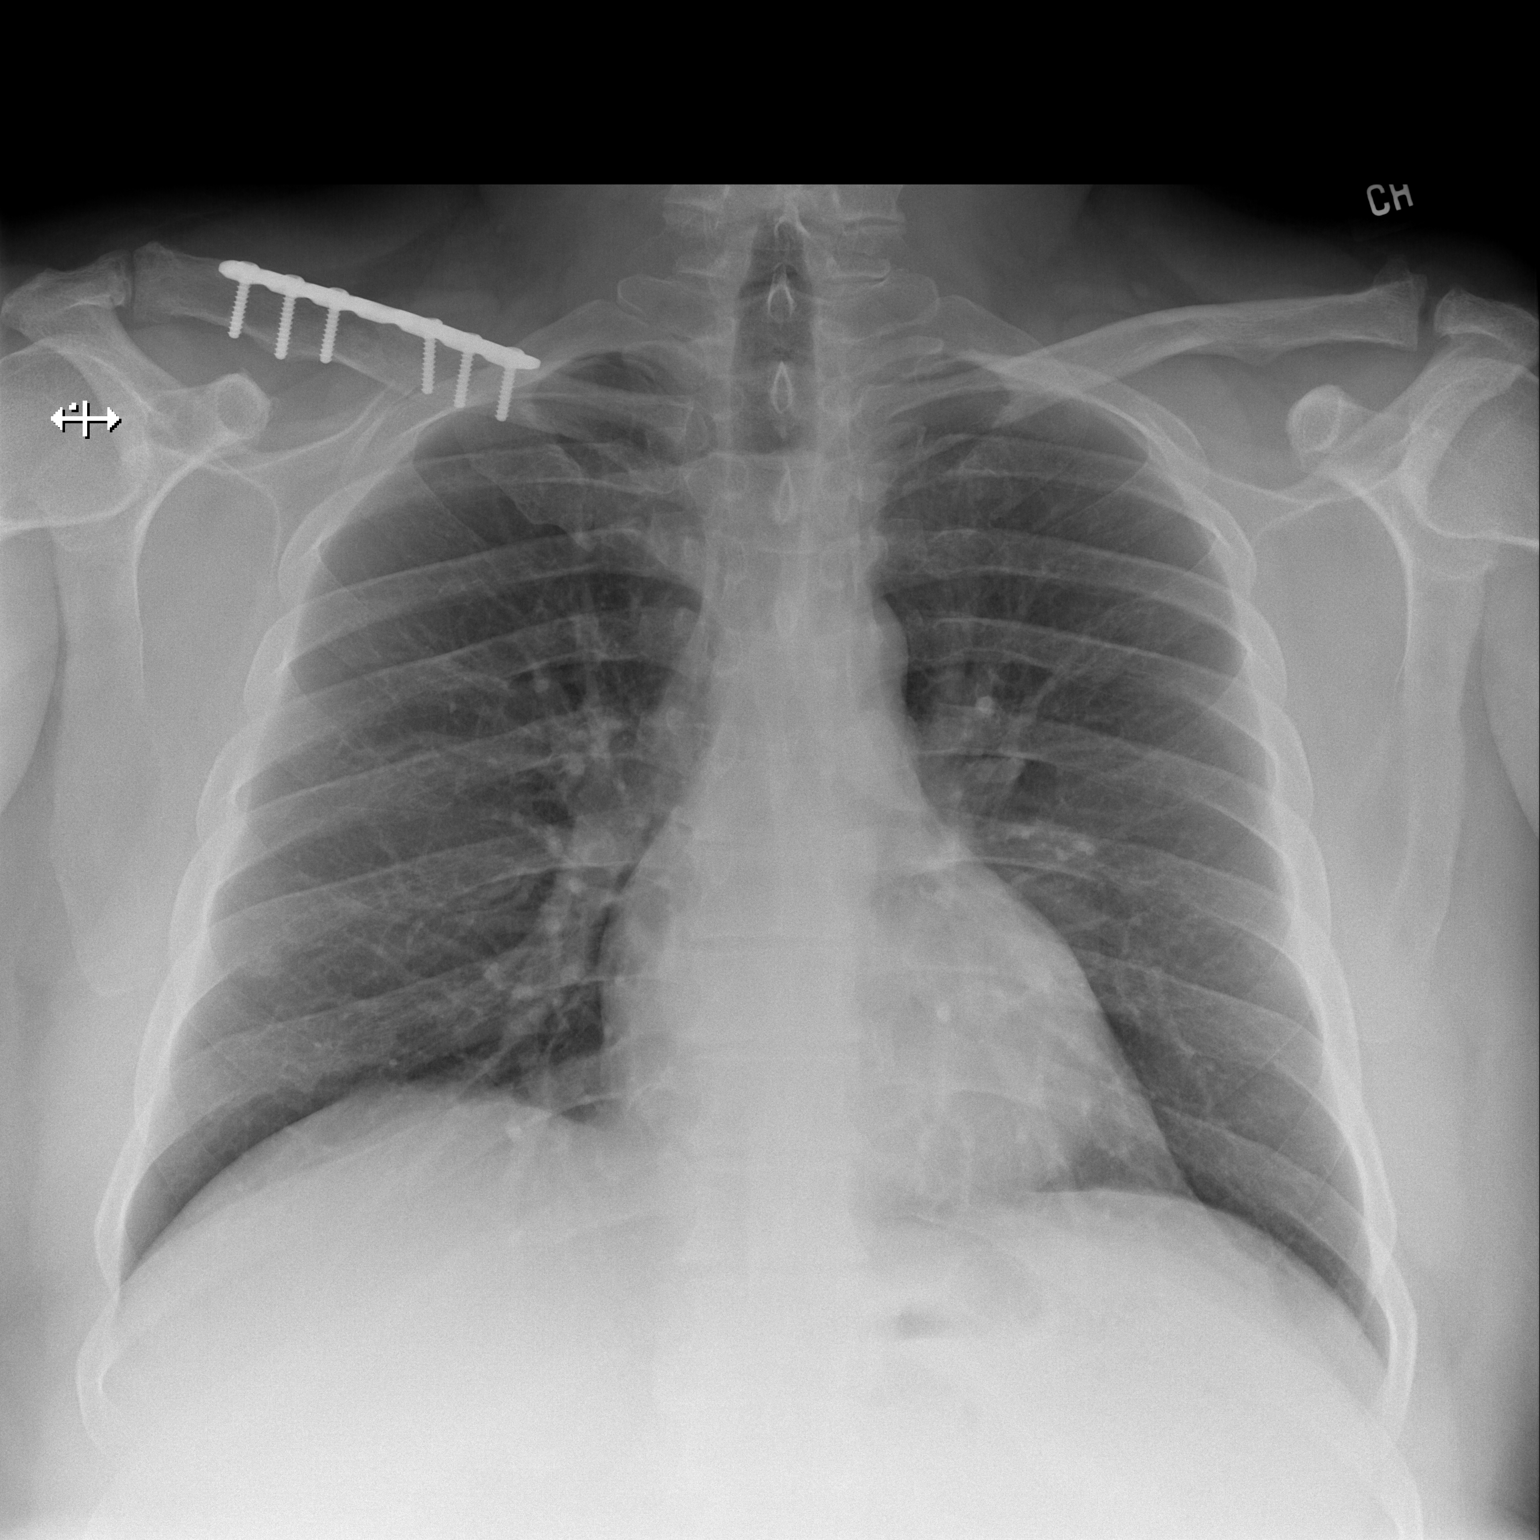

[w chest lat]
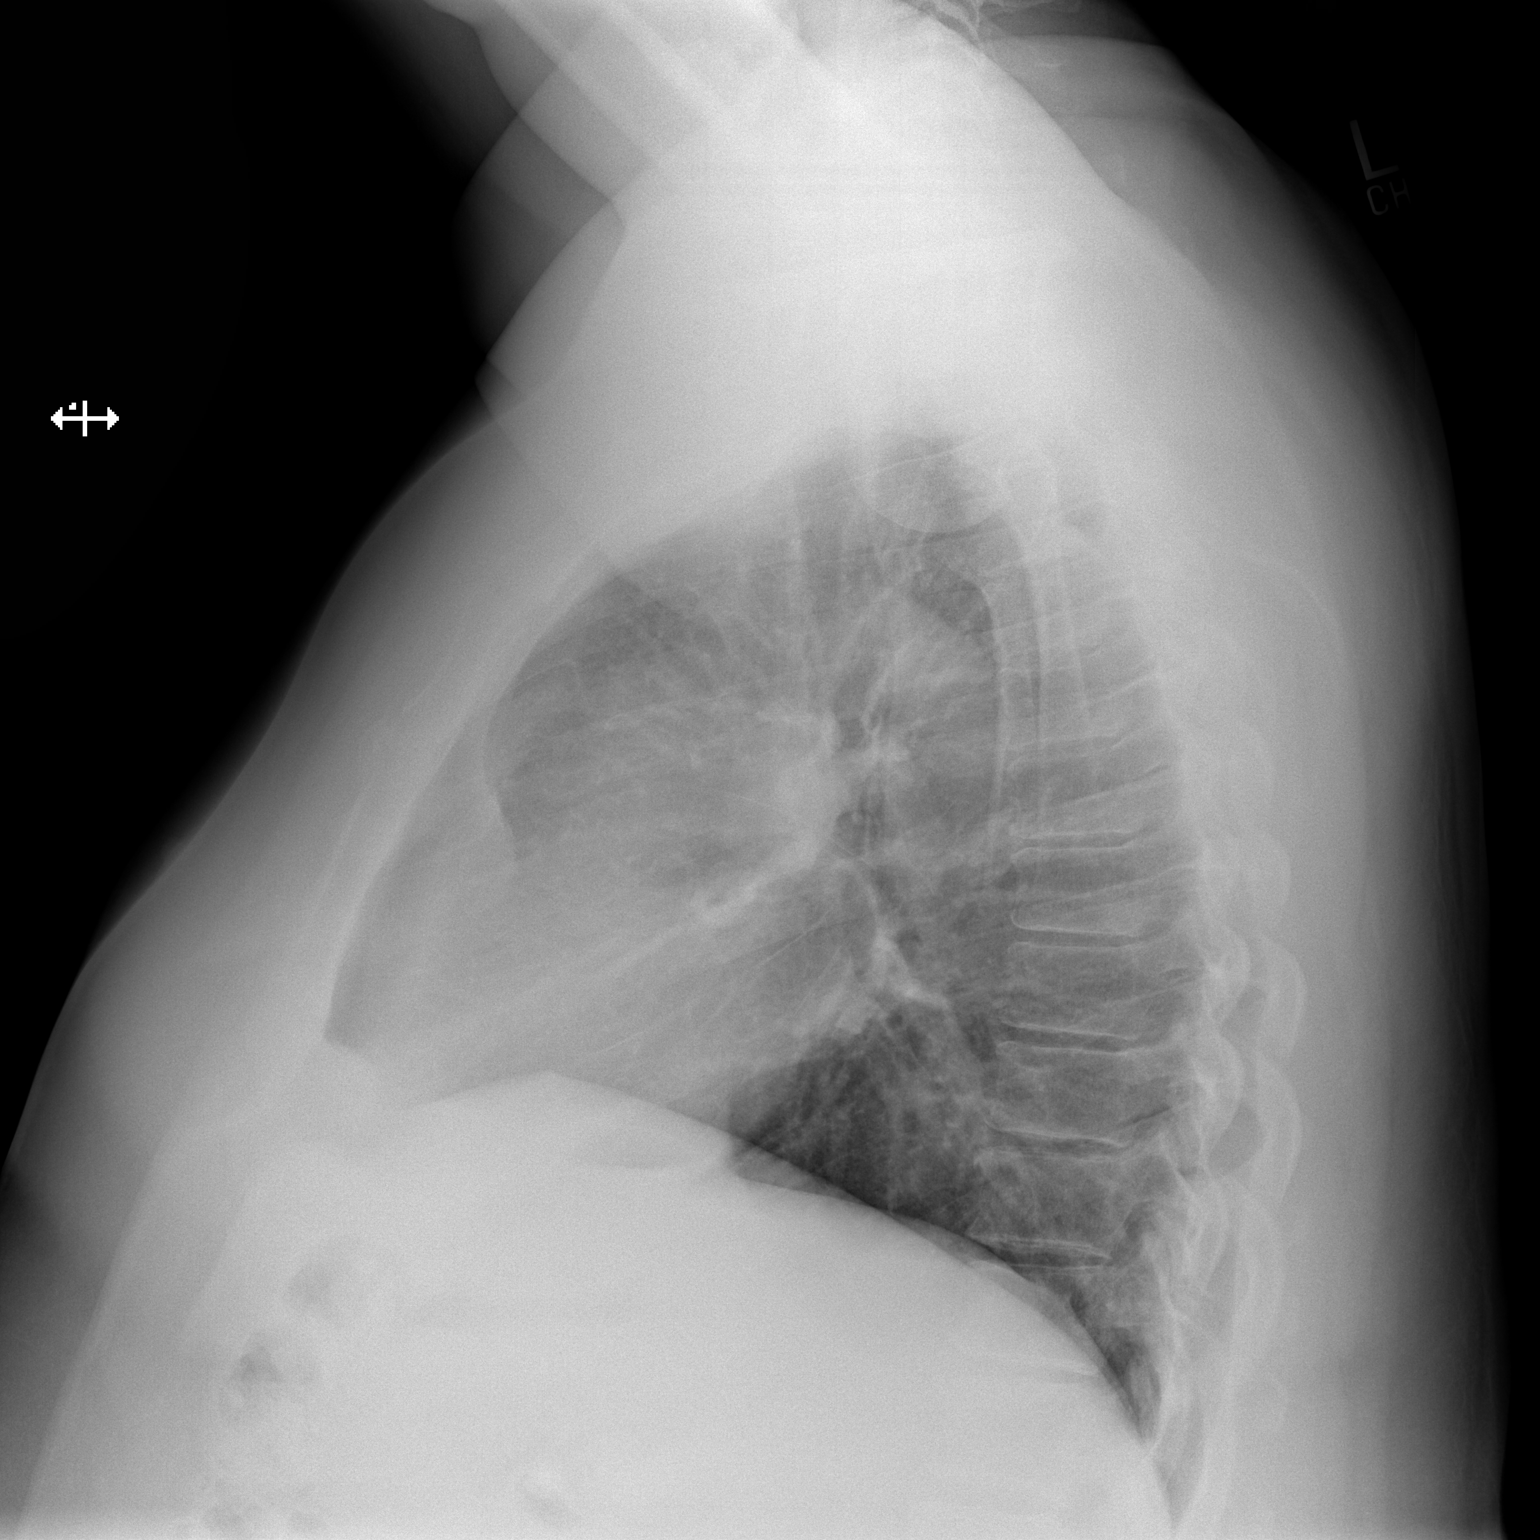

[2 of 2 positions shown; findings below may reference images not displayed]

FINDINGS: Mediastinum and hilar structures stable. Stable anterior pleural
thickening seen on lateral view unchanged from prior exam.
Questionable nodular density possibly nipple shadow projected over
right lung base. Repeat PA lateral chest x-ray with nipple markers
suggested. Heart size normal. No pleural effusion or pneumothorax .
Plate and screw fixation of the right clavicle is present.
IMPRESSION: Questionable nodular density projected over the right lung base,
possibly nipple shadow. Repeat PA lateral chest x-ray with nipple
markers suggested .

## 2018-07-12 ENCOUNTER — Other Ambulatory Visit: Payer: Self-pay

## 2018-07-12 ENCOUNTER — Ambulatory Visit: Payer: Commercial Managed Care - PPO | Admitting: Neurology

## 2018-07-12 ENCOUNTER — Encounter: Payer: Self-pay | Admitting: Neurology

## 2018-07-12 VITALS — BP 146/89 | HR 76 | Ht 65.0 in | Wt 258.0 lb

## 2018-07-12 DIAGNOSIS — Z9989 Dependence on other enabling machines and devices: Secondary | ICD-10-CM

## 2018-07-12 DIAGNOSIS — G4733 Obstructive sleep apnea (adult) (pediatric): Secondary | ICD-10-CM | POA: Diagnosis not present

## 2018-07-12 NOTE — Progress Notes (Signed)
Subjective:    Patient ID: Scott York is a 53 y.o. male.  HPI     Interim history:   Scott York is a 53 year old right-handed gentleman with an underlying medical history of hypertension, hyperlipidemia, cholelithiasis, lumbar degenerative disc disease, status post left total knee replacement, status post tonsillectomy, history of UTI and morbid obesity with a BMI of over 67, who presents for follow-up consultation of his obstructive sleep apnea after recent home sleep testing and starting AutoPap therapy.  The patient is unaccompanied today.  I first met him on 03/31/2018 and virtual visit at the request of his primary care nurse practitioner, at which time he reported that he was advised to have a sleep study done due to a concern at the time of his DOT physical.  The patient had a home sleep test on 04/09/2018 which indicated moderate obstructive sleep apnea with an AHI of 23.8/h, O2 nadir of 84%.  He was advised to start AutoPap therapy.   Today, 07/12/2018: I reviewed his AutoPap compliance data from 06/08/2018 through 07/07/2018 which is a total of 30 days, during which time he used his AutoPap 26 days with percent used days greater than 4 hours at 87%, indicating very good compliance with average usage of 7 hours and 57 minutes, residual AHI at goal at 2.1/h, leak on the low side with a 95th percentile at 3.1 L/min, 95th percentile of pressure at 12.7 cm with a range of 7 cm to 13 cm with EPR.  He reports being fully compliant with his AutoPap, with the exception of Fridays as he dedicates this day of the week to his wife he reports.  He has not noticed much in the way of difference in his daytime symptoms but admits that his sleep quality, sleep consolidation and nocturia are better.  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   The patient's allergies, current medications, family history, past medical history, past social history, past surgical  history and problem list were reviewed and updated as appropriate.   Previously:  03/31/2018:  His Epworth sleepiness score is 5 out of 24, fatigue severity score is 13 out of 63. 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 He was advised to seek a sleep study based on a concern on the physical exam for high risk for sleep apnea, he reports that his neck circumference at the DOT physical was 17-1/2 inches. Since then, he has been trying to lose weight, in January 2020 his weight was recorded at 271.4 pounds at his primary care visit, he reports a current weight of 249 pounds. He estimates that currently as far as his own measure of neck size, his neck circumference is about 16-1/4 or 16-1/2 inches. He had a tonsillectomy as a child, is not aware of any family history of OSA. He is a nonsmoker, has smoked the occasional cigar, quit drinking alcohol altogether in 2006 because when he drank alcohol he would drink too much she admits. He drinks caffeine in the form of coffee, up to 24 ounces per day, may need to drink more water he admits. He drinks soda but without caffeine typically. Bedtime is around 9, typically he is asleep by 10, he does watch TV in the bedroom but turns it off. His rise time is between 5:30 and 6. He has nocturia  about twice per average night and denies morning headaches. His wife has voiced the occasional concern about pauses in his breathing and he does snore. He lives with his wife, has 3 biological children. In his household currently he has a step/adopted daughter, her fianc, and a grandchild also lives in the household. He has no back pain and is a restless sleeper. He has had no back surgeries, he does take tramadol twice daily for his pain and is also status post left total knee replacement.  His Past Medical History Is Significant For: Past Medical History:  Diagnosis Date  .  Cholelithiasis   . DDD (degenerative disc disease), lumbar    L5-S1  . Hyperlipidemia   . Hypertension   . Obesity   . UTI (lower urinary tract infection)     His Past Surgical History Is Significant For: Past Surgical History:  Procedure Laterality Date  . APPENDECTOMY  04/20/2011   Procedure: APPENDECTOMY;  Surgeon: Rolm Bookbinder, MD;  Location: [place];  Service: General;  Laterality: N/A;  Opened '@1855' .  . FRACTURE SURGERY Right    Clavicle  . KNEE ARTHROSCOPY W/ MENISCAL REPAIR Left   . NOSE SURGERY    . TONSILLECTOMY    . TOTAL KNEE ARTHROPLASTY Left 07/16/2015   Procedure: TOTAL KNEE ARTHROPLASTY;  Surgeon: Dorna Leitz, MD;  Location: [place];  Service: Orthopedics;  Laterality: Left;  . WISDOM TOOTH EXTRACTION      His Family History Is Significant For: Family History  Problem Relation Age of Onset  . Heart disease Paternal Grandfather     His Social History Is Significant For: Social History   Socioeconomic History  . Marital status: Married    Spouse name: Not on file  . Number of children: Not on file  . Years of education: Not on file  . Highest education level: Not on file  Occupational History  . Not on file  Social Needs  . Financial resource strain: Not on file  . Food insecurity    Worry: Not on file    Inability: Not on file  . Transportation needs    Medical: Not on file    Non-medical: Not on file  Tobacco Use  . Smoking status: Former Smoker    Types: Cigars    Quit date: 04/30/2011    Years since quitting: 7.2  . Smokeless tobacco: Never Used  Substance and Sexual Activity  . Alcohol use: No  . Drug use: No  . Sexual activity: Not on file  Lifestyle  . Physical activity    Days per week: Not on file    Minutes per session: Not on file  . Stress: Not on file  Relationships  . Social Herbalist on phone: Not on file    Gets together: Not on file    Attends religious service: Not on file    Active member of club or  organization: Not on file    Attends meetings of clubs or organizations: Not on file    Relationship status: Not on file  Other Topics Concern  . Not on file  Social History Narrative  . Not on file    His Allergies Are:  Allergies  Allergen Reactions  . No Known Allergies   :   His Current Medications Are:  Outpatient Encounter Medications as of 07/12/2018  Medication Sig  . amLODipine (NORVASC) 10 MG tablet Take 10 mg by mouth daily.  Marland Kitchen aspirin EC 81 MG tablet Take  81 mg by mouth daily.  Marland Kitchen atorvastatin (LIPITOR) 20 MG tablet Take 20 mg by mouth daily.  . chlorthalidone (HYGROTON) 25 MG tablet   . cyclobenzaprine (FLEXERIL) 10 MG tablet TAKE 1 TABLET BY MOUTH EVERYDAY AT BEDTIME  . doxazosin (CARDURA) 2 MG tablet Take 2 mg by mouth daily.  . Multiple Vitamin (MULITIVITAMIN WITH MINERALS) TABS Take 1 tablet by mouth daily.  . OMEGA 3 1200 MG CAPS Take 1,200 mg by mouth daily.   . traMADol (ULTRAM) 50 MG tablet Take 50 mg by mouth 2 (two) times daily.   . [DISCONTINUED] aspirin EC 325 MG EC tablet Take 1 tablet (325 mg total) by mouth 2 (two) times daily after a meal.  . [DISCONTINUED] aspirin EC 325 MG tablet Take 1 tablet (325 mg total) by mouth 2 (two) times daily after a meal. Take x 1 month post op to decrease risk of blood clots.  . [DISCONTINUED] chlorthalidone (HYGROTON) 50 MG tablet Take 50 mg by mouth daily.  . [DISCONTINUED] hydrochlorothiazide (HYDRODIURIL) 25 MG tablet Take 25 mg by mouth daily.  . [DISCONTINUED] lisinopril (PRINIVIL,ZESTRIL) 40 MG tablet Take 40 mg by mouth daily.  . [DISCONTINUED] methocarbamol (ROBAXIN-750) 750 MG tablet Take 1 tablet (750 mg total) by mouth every 8 (eight) hours as needed for muscle spasms.  . [DISCONTINUED] nebivolol (BYSTOLIC) 10 MG tablet Take 10 mg by mouth daily.  . [DISCONTINUED] oxyCODONE-acetaminophen (PERCOCET/ROXICET) 5-325 MG tablet Take 1-2 tablets by mouth every 4 (four) hours as needed for severe pain.   No  facility-administered encounter medications on file as of 07/12/2018.   :  Review of Systems:  Out of a complete 14 point review of systems, all are reviewed and negative with the exception of these symptoms as listed below: Review of Systems  Neurological:       Pt presents today to discuss his auto pap. Pt reports that he does not notice any difference in how he feels.    Objective:  Neurological Exam  Physical Exam Physical Examination:   Vitals:   07/12/18 0821  BP: (!) 146/89  Pulse: 76    General Examination: The patient is a very pleasant 53 y.o. male in no acute distress. He appears well-developed and well-nourished and well groomed.   HEENT: Normocephalic, atraumatic, pupils are equal, round and reactive to light and accommodation. He wears corrective eyeglasses. Extraocular tracking is good without limitation to gaze excursion or nystagmus noted. Normal smooth pursuit is noted. Hearing is grossly intact. Face is symmetric with normal facial animation and normal facial sensation. Speech is clear with no dysarthria noted. There is no hypophonia. There is no lip, neck/head, jaw or voice tremor. Neck is supple with full range of passive and active motion. There are no carotid bruits on auscultation. Oropharynx exam reveals: mild mouth dryness, adequate dental hygiene and moderate airway crowding. Tongue protrudes centrally and palate elevates symmetrically.   Chest: Clear to auscultation without wheezing, rhonchi or crackles noted.  Heart: S1+S2+0, regular and normal without murmurs, rubs or gallops noted.   Abdomen: Soft, non-tender and non-distended with normal bowel sounds appreciated on auscultation.  Extremities: There is no pitting edema in the distal lower extremities bilaterally.   Skin: Warm and dry without trophic changes noted.  Musculoskeletal: exam reveals no obvious joint deformities, tenderness or joint swelling or erythema.   Neurologically:  Mental status:  The patient is awake, alert and oriented in all 4 spheres. His immediate and remote memory, attention, language skills and fund  of knowledge are appropriate. There is no evidence of aphasia, agnosia, apraxia or anomia. Speech is clear with normal prosody and enunciation. Thought process is linear. Mood is normal and affect is normal.  Cranial nerves II - XII are as described above under HEENT exam. Motor exam: Normal bulk, strength and tone is noted. There is no drift, tremor or rebound. Romberg is negative. Reflexes are 1+ in the UEs and knees. Fine motor skills and coordination: grossly intact.  Cerebellar testing: No dysmetria or intention tremor on finger to nose testing. Heel to shin is unremarkable bilaterally. There is no truncal or gait ataxia.  Sensory exam: intact to light touch in the upper and lower extremities.  Gait, station and balance: He stands easily. No veering to one side is noted. No leaning to one side is noted. Posture is age-appropriate and stance is narrow based. Gait shows normal stride length and normal pace. No problems turning are noted. Tandem walk is unremarkable.  Assessment and Plan:  In summary, DANN GALICIA is a very pleasant 53 y.o.-year old male  with an underlying medical history of hypertension, hyperlipidemia, cholelithiasis, lumbar degenerative disc disease, status post left total knee replacement, status post tonsillectomy, history of UTI and morbid obesity with a BMI of over 40, who presents for follow-up consultation of his obstructive sleep apnea after recent home sleep testing and starting AutoPap therapy. His home sleep test from 04/09/2018 indicated moderate obstructive sleep apnea with an AHI of 23.8/h, O2 nadir of 84%.  He is compliant with his AutoPap.  His AHI is at goal, leak on the low side.  He tolerates the pressure and the mask, he uses a [employer]ear full facemask.  He is up-to-date with his supplies and should be receiving supplies this month.  His  DME company is [facility] [facility]. He is commended for his treatment adherence. He is advised to be fully compliant with his AutoPap and follow-up routinely in 6 months, sooner if needed.  He can see 1 of our nurse practitioners and is agreeable.  I answered all his questions today and he was in agreement. I spent 15 minutes in total face-to-face time with the patient, more than 50% of which was spent in counseling and coordination of care, reviewing test results, reviewing medication and discussing or reviewing the diagnosis of OSA, its prognosis and treatment options. Pertinent laboratory and imaging test results that were available during this visit with the patient were reviewed by me and considered in my medical decision making (see chart for details).

## 2018-07-12 NOTE — Patient Instructions (Signed)
Please continue using your autoPAP regularly. While your insurance requires that you use PAP at least 4 hours each night on 70% of the nights, I recommend, that you not skip any nights and use it throughout the night if you can. Getting used to PAP and staying with the treatment long term does take time and patience and discipline. Untreated obstructive sleep apnea when it is moderate to severe can have an adverse impact on cardiovascular health and raise her risk for heart disease, arrhythmias, hypertension, congestive heart failure, stroke and diabetes. Untreated obstructive sleep apnea causes sleep disruption, nonrestorative sleep, and sleep deprivation. This can have an impact on your day to day functioning and cause daytime sleepiness and impairment of cognitive function, memory loss, mood disturbance, and problems focussing. Using PAP regularly can improve these symptoms.  Keep up the good work! We will see you back in 6 months for sleep apnea check up, and if you continue to do well on autoPAP, we can see you once a year thereafter.

## 2019-01-17 ENCOUNTER — Ambulatory Visit: Payer: PRIVATE HEALTH INSURANCE | Admitting: Adult Health

## 2019-02-06 ENCOUNTER — Encounter: Payer: Self-pay | Admitting: Neurology

## 2019-02-08 ENCOUNTER — Encounter: Payer: Self-pay | Admitting: Adult Health

## 2019-02-08 ENCOUNTER — Other Ambulatory Visit: Payer: Self-pay

## 2019-02-08 ENCOUNTER — Ambulatory Visit (INDEPENDENT_AMBULATORY_CARE_PROVIDER_SITE_OTHER): Payer: Commercial Managed Care - PPO | Admitting: Adult Health

## 2019-02-08 VITALS — BP 138/74 | HR 74 | Temp 97.7°F | Ht 65.0 in | Wt 285.0 lb

## 2019-02-08 DIAGNOSIS — G4733 Obstructive sleep apnea (adult) (pediatric): Secondary | ICD-10-CM | POA: Diagnosis not present

## 2019-02-08 DIAGNOSIS — Z9989 Dependence on other enabling machines and devices: Secondary | ICD-10-CM

## 2019-02-08 NOTE — Patient Instructions (Signed)
Continue using CPAP nightly and greater than 4 hours each night °If your symptoms worsen or you develop new symptoms please let us know.  ° °

## 2019-02-08 NOTE — Progress Notes (Addendum)
PATIENT: Scott York DOB: 25-Feb-1965  REASON FOR VISIT: follow up HISTORY FROM: patient  HISTORY OF PRESENT ILLNESS: Today 02/08/19:  Scott York is a 54 year old male with a history of obstructive sleep apnea on CPAP.  His download indicates that he uses machine 27 out of 30 days for compliance of 90%.  He uses machine greater than 4 hours each night.  On average he uses his machine 8 hours and 25 minutes.  His residual AHI is 1.4 on 7 to 13 cm of water with EPR of 3.  His leak in the 95th percentile is 2.1 L/min.  Overall he feels that the CPAP is working well for him.  He denies any new issues.  He returns today for evaluation  HISTORY  07/12/2018: I reviewed his AutoPap compliance data from 06/08/2018 through 07/07/2018 which is a total of 30 days, during which time he used his AutoPap 26 days with percent used days greater than 4 hours at 87%, indicating very good compliance with average usage of 7 hours and 57 minutes, residual AHI at goal at 2.1/h, leak on the low side with a 95th percentile at 3.1 L/min, 95th percentile of pressure at 12.7 cm with a range of 7 cm to 13 cm with EPR.  He reports being fully compliant with his AutoPap, with the exception of Fridays as he dedicates this day of the week to his wife he reports.  He has not noticed much in the way of difference in his daytime symptoms but admits that his sleep quality, sleep consolidation and nocturia are better.  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  REVIEW OF SYSTEMS: Out of a complete 14 system review of symptoms, the patient complains only of the following symptoms, and all other reviewed systems are negative.  ESS 2  FSS 13  ALLERGIES: Allergies  Allergen Reactions  . No Known Allergies     HOME MEDICATIONS: Outpatient Medications Prior to Visit  Medication Sig Dispense Refill  . amLODipine (NORVASC) 10 MG tablet Take 10 mg by mouth daily.    Marland Kitchen aspirin EC 81 MG  tablet Take 81 mg by mouth daily.    Marland Kitchen atorvastatin (LIPITOR) 20 MG tablet Take 20 mg by mouth daily.  3  . chlorthalidone (HYGROTON) 25 MG tablet     . cyclobenzaprine (FLEXERIL) 10 MG tablet TAKE 1 TABLET BY MOUTH EVERYDAY AT BEDTIME    . doxazosin (CARDURA) 2 MG tablet Take 2 mg by mouth daily.    . Multiple Vitamin (MULITIVITAMIN WITH MINERALS) TABS Take 1 tablet by mouth daily.    . OMEGA 3 1200 MG CAPS Take 1,200 mg by mouth daily.     . traMADol (ULTRAM) 50 MG tablet Take 50 mg by mouth 2 (two) times daily.   5   No facility-administered medications prior to visit.    PAST MEDICAL HISTORY: Past Medical History:  Diagnosis Date  . Cholelithiasis   . DDD (degenerative disc disease), lumbar    L5-S1  . Hyperlipidemia   . Hypertension   . Obesity   . UTI (lower urinary tract infection)     PAST SURGICAL HISTORY: Past Surgical History:  Procedure Laterality Date  . APPENDECTOMY  04/20/2011   Procedure: APPENDECTOMY;  Surgeon: Rolm Bookbinder, MD;  Location: [facility];  Service: General;  Laterality: N/A;  Opened @1855 .  Marland Kitchen FRACTURE SURGERY Right    Clavicle  . KNEE ARTHROSCOPY W/  MENISCAL REPAIR Left   . NOSE SURGERY    . TONSILLECTOMY    . TOTAL KNEE ARTHROPLASTY Left 07/16/2015   Procedure: TOTAL KNEE ARTHROPLASTY;  Surgeon: Dorna Leitz, MD;  Location: [facility];  Service: Orthopedics;  Laterality: Left;  . WISDOM TOOTH EXTRACTION      FAMILY HISTORY: Family History  Problem Relation Age of Onset  . Heart disease Paternal Grandfather     SOCIAL HISTORY: Social History   Socioeconomic History  . Marital status: Married    Spouse name: Not on file  . Number of children: Not on file  . Years of education: Not on file  . Highest education level: Not on file  Occupational History  . Not on file  Tobacco Use  . Smoking status: Former Smoker    Types: Cigars    Quit date: 04/30/2011    Years since quitting: 7.7  . Smokeless tobacco: Never Used  Substance and Sexual  Activity  . Alcohol use: No  . Drug use: No  . Sexual activity: Not on file  Other Topics Concern  . Not on file  Social History Narrative  . Not on file   Social Determinants of Health   Financial Resource Strain:   . Difficulty of Paying Living Expenses: Not on file  Food Insecurity:   . Worried About Charity fundraiser in the Last Year: Not on file  . Ran Out of Food in the Last Year: Not on file  Transportation Needs:   . Lack of Transportation (Medical): Not on file  . Lack of Transportation (Non-Medical): Not on file  Physical Activity:   . Days of Exercise per Week: Not on file  . Minutes of Exercise per Session: Not on file  Stress:   . Feeling of Stress : Not on file  Social Connections:   . Frequency of Communication with Friends and Family: Not on file  . Frequency of Social Gatherings with Friends and Family: Not on file  . Attends Religious Services: Not on file  . Active Member of Clubs or Organizations: Not on file  . Attends Archivist Meetings: Not on file  . Marital Status: Not on file  Intimate Partner Violence:   . Fear of Current or Ex-Partner: Not on file  . Emotionally Abused: Not on file  . Physically Abused: Not on file  . Sexually Abused: Not on file      PHYSICAL EXAM  Vitals:   02/08/19 0741  BP: 138/74  Pulse: 74  Temp: 97.7 F (36.5 C)  Weight: 285 lb (129.3 kg)  Height: 5\' 5"  (1.651 m)   Body mass index is 47.43 kg/m.  Generalized: Well developed, in no acute distress  Chest: Lungs clear to auscultation bilaterally  Neurological examination  Mentation: Alert oriented to time, place, history taking. Follows all commands speech and language fluent Cranial nerve II-XII: Extraocular movements were full, visual field were full on confrontational test Head turning and shoulder shrug  were normal and symmetric. Motor: The motor testing reveals 5 over 5 strength of all 4 extremities. Good symmetric motor tone is noted  throughout.  Sensory: Sensory testing is intact to soft touch on all 4 extremities. No evidence of extinction is noted.  Gait and station: Gait is normal.   DIAGNOSTIC DATA (LABS, IMAGING, TESTING) - I reviewed patient records, labs, notes, testing and imaging myself where available.  Lab Results  Component Value Date   WBC 9.5 07/17/2015   HGB 13.6 07/17/2015  HCT 41.8 07/17/2015   MCV 88.2 07/17/2015   PLT 246 07/17/2015      Component Value Date/Time   NA 137 07/17/2015 0825   K 3.9 07/17/2015 0825   CL 105 07/17/2015 0825   CO2 23 07/17/2015 0825   GLUCOSE 151 (H) 07/17/2015 0825   BUN 12 07/17/2015 0825   CREATININE 0.92 07/17/2015 0825   CALCIUM 9.2 07/17/2015 0825   PROT 6.5 07/09/2015 1413   ALBUMIN 3.9 07/09/2015 1413   AST 36 07/09/2015 1413   ALT 34 07/09/2015 1413   ALKPHOS 72 07/09/2015 1413   BILITOT 0.5 07/09/2015 1413   GFRNONAA >60 07/17/2015 0825   GFRAA >60 07/17/2015 0825       ASSESSMENT AND PLAN 54 y.o. year old male  has a past medical history of Cholelithiasis, DDD (degenerative disc disease), lumbar, Hyperlipidemia, Hypertension, Obesity, and UTI (lower urinary tract infection). here with :  1. Obstructive sleep apnea on CPAP  The patient's CPAP download shows excellent compliance and good treatment of his apnea.  He is encouraged to continue using CPAP nightly and greater than 4 hours each night.  He is advised that if his symptoms worsen or he develops new symptoms he should let us know.  He will follow-up in 1 year or sooner if needed.    I spent 15 minutes with the patient. 50% of this time was spent reviewing CPAP download   Ward Givens, MSN, NP-C 02/08/2019, 8:25 AM [facility] [address] [place], [place] [address] [phone]  I reviewed the above note and documentation by the Nurse Practitioner and agree with the history, exam, assessment and plan as outlined above. I was available for  consultation. Star Age, MD, PhD [facility])

## 2019-07-07 ENCOUNTER — Encounter (INDEPENDENT_AMBULATORY_CARE_PROVIDER_SITE_OTHER): Payer: PRIVATE HEALTH INSURANCE | Admitting: Internal Medicine

## 2019-07-07 ENCOUNTER — Encounter: Payer: Self-pay | Admitting: Internal Medicine

## 2019-07-13 ENCOUNTER — Ambulatory Visit: Payer: PRIVATE HEALTH INSURANCE

## 2019-07-14 NOTE — Progress Notes (Signed)
Error

## 2019-09-07 ENCOUNTER — Encounter: Payer: Self-pay | Admitting: Family Medicine

## 2019-09-07 ENCOUNTER — Other Ambulatory Visit: Payer: Self-pay

## 2019-09-07 ENCOUNTER — Ambulatory Visit: Payer: Commercial Managed Care - PPO | Admitting: Family Medicine

## 2019-09-07 VITALS — BP 138/80 | HR 76 | Temp 98.0°F | Ht 65.5 in | Wt 285.0 lb

## 2019-09-07 DIAGNOSIS — N529 Male erectile dysfunction, unspecified: Secondary | ICD-10-CM | POA: Diagnosis not present

## 2019-09-07 DIAGNOSIS — E782 Mixed hyperlipidemia: Secondary | ICD-10-CM

## 2019-09-07 DIAGNOSIS — M1712 Unilateral primary osteoarthritis, left knee: Secondary | ICD-10-CM

## 2019-09-07 DIAGNOSIS — F1190 Opioid use, unspecified, uncomplicated: Secondary | ICD-10-CM

## 2019-09-07 DIAGNOSIS — Z131 Encounter for screening for diabetes mellitus: Secondary | ICD-10-CM | POA: Diagnosis not present

## 2019-09-07 DIAGNOSIS — S86019A Strain of unspecified Achilles tendon, initial encounter: Secondary | ICD-10-CM | POA: Insufficient documentation

## 2019-09-07 DIAGNOSIS — I10 Essential (primary) hypertension: Secondary | ICD-10-CM | POA: Diagnosis not present

## 2019-09-07 DIAGNOSIS — G4733 Obstructive sleep apnea (adult) (pediatric): Secondary | ICD-10-CM

## 2019-09-07 DIAGNOSIS — Z9989 Dependence on other enabling machines and devices: Secondary | ICD-10-CM | POA: Insufficient documentation

## 2019-09-07 DIAGNOSIS — E785 Hyperlipidemia, unspecified: Secondary | ICD-10-CM | POA: Insufficient documentation

## 2019-09-07 LAB — LIPID PANEL
Cholesterol: 122 mg/dL (ref 0–200)
HDL: 56.9 mg/dL (ref >39.00)
LDL Cholesterol: 49 mg/dL (ref 0–99)
NonHDL: 64.91
Total CHOL/HDL Ratio: 2
Triglycerides: 81 mg/dL (ref 0.0–149.0)
VLDL: 16.2 mg/dL (ref 0.0–40.0)

## 2019-09-07 LAB — COMPREHENSIVE METABOLIC PANEL
ALT: 33 U/L (ref 0–53)
AST: 27 U/L (ref 0–37)
Albumin: 4.3 g/dL (ref 3.5–5.2)
Alkaline Phosphatase: 83 U/L (ref 39–117)
BUN: 14 mg/dL (ref 6–23)
CO2: 27 mEq/L (ref 19–32)
Calcium: 10 mg/dL (ref 8.4–10.5)
Chloride: 105 mEq/L (ref 96–112)
Creatinine, Ser: 0.81 mg/dL (ref 0.40–1.50)
GFR: 99.01 mL/min (ref >60.00)
Glucose, Bld: 92 mg/dL (ref 70–99)
Potassium: 3.7 mEq/L (ref 3.5–5.1)
Sodium: 140 mEq/L (ref 135–145)
Total Bilirubin: 0.5 mg/dL (ref 0.2–1.2)
Total Protein: 7.3 g/dL (ref 6.0–8.3)

## 2019-09-07 LAB — HEMOGLOBIN A1C: Hgb A1c MFr Bld: 5.7 % (ref 4.6–6.5)

## 2019-09-07 MED ORDER — SILDENAFIL CITRATE 100 MG PO TABS: 100.0000 mg | ORAL_TABLET | ORAL | 0 refills | Status: DC | PRN

## 2019-09-07 MED ORDER — DOXAZOSIN MESYLATE 2 MG PO TABS
2.0000 mg | ORAL_TABLET | Freq: Every day | ORAL | 3 refills | Status: DC
Start: 2019-09-07 — End: 2020-10-08

## 2019-09-07 MED ORDER — AMLODIPINE BESYLATE 10 MG PO TABS: 10.0000 mg | ORAL_TABLET | Freq: Every day | ORAL | 3 refills | Status: DC

## 2019-09-07 MED ORDER — ATORVASTATIN CALCIUM 20 MG PO TABS: 20.0000 mg | ORAL_TABLET | Freq: Every day | ORAL | 3 refills | Status: DC

## 2019-09-07 MED ORDER — CHLORTHALIDONE 25 MG PO TABS: 25.0000 mg | ORAL_TABLET | Freq: Every day | ORAL | 3 refills | Status: DC

## 2019-09-07 NOTE — Assessment & Plan Note (Signed)
BP at goal, continue doxazosin, chlorthalidone, and amlodipine

## 2019-09-07 NOTE — Assessment & Plan Note (Signed)
Discussed risk of tramadol + flexeril in combination and advised against this due to sedation risk. Reports use for >10 years.

## 2019-09-07 NOTE — Assessment & Plan Note (Signed)
Pt resistant to change. Knows he needs to lose weight but low motivation. Encouraged heart healthy diet and weight loss to help with pain. Not interested in surgery due to having a friend die during the procedure

## 2019-09-07 NOTE — Assessment & Plan Note (Signed)
Ok response to viagra. Denies cp or sob

## 2019-09-07 NOTE — Assessment & Plan Note (Signed)
Endorses using - continue cpap

## 2019-09-07 NOTE — Assessment & Plan Note (Addendum)
Chronic pain s/p knee replacement. Encouraged trial of tumeric. OK for continued tramadol - will return for controlled substance contract visit per policy.

## 2019-09-07 NOTE — Progress Notes (Signed)
Subjective:     Scott York is a 54 y.o. male presenting for No chief complaint on file.     HPI  #Knees/ankle/back - was on percocet  - was on significant amount of ibuprofen - switched to tramadol taken after work [employer]) so as not to involve job - only taking 2 per day but with significant pain throughout the day - has been doing this for 15 years and does not want to change -   #obesity - did good losing down to 258 - but got off diet when he reached   Review of Systems   Social History   Tobacco Use  Smoking Status Former Smoker  . Types: Cigars  . Quit date: 04/30/2011  . Years since quitting: 8.3  Smokeless Tobacco Never Used        Objective:    BP Readings from Last 3 Encounters:  09/07/19 138/80  02/08/19 138/74  07/12/18 (!) 146/89   Wt Readings from Last 3 Encounters:  09/07/19 285 lb (129.3 kg)  02/08/19 285 lb (129.3 kg)  07/12/18 258 lb (117 kg)    BP 138/80   Pulse 76   Temp 98 F (36.7 C) (Temporal)   Ht 5' 5.5" (1.664 m)   Wt 285 lb (129.3 kg)   SpO2 96%   BMI 46.71 kg/m    Physical Exam Constitutional:      Appearance: Normal appearance. He is not ill-appearing or diaphoretic.  HENT:     Right Ear: External ear normal.     Left Ear: External ear normal.  Eyes:     General: No scleral icterus.    Extraocular Movements: Extraocular movements intact.     Conjunctiva/sclera: Conjunctivae normal.  Cardiovascular:     Rate and Rhythm: Normal rate and regular rhythm.     Heart sounds: No murmur heard.   Pulmonary:     Effort: Pulmonary effort is normal. No respiratory distress.     Breath sounds: Normal breath sounds. No wheezing.  Musculoskeletal:     Cervical back: Neck supple.  Skin:    General: Skin is warm and dry.  Neurological:     Mental Status: He is alert. Mental status is at baseline.  Psychiatric:        Mood and Affect: Mood normal.           Assessment & Plan:   Problem List Items  Addressed This Visit      Cardiovascular and Mediastinum   Hypertension    BP at goal, continue doxazosin, chlorthalidone, and amlodipine      Relevant Medications   amLODipine (NORVASC) 10 MG tablet   atorvastatin (LIPITOR) 20 MG tablet   chlorthalidone (HYGROTON) 25 MG tablet   doxazosin (CARDURA) 2 MG tablet   sildenafil (VIAGRA) 100 MG tablet   Other Relevant Orders   Comprehensive metabolic panel     Respiratory   OSA on CPAP    Endorses using - continue cpap        Musculoskeletal and Integument   Primary osteoarthritis of left knee (Chronic)    Chronic pain s/p knee replacement. Encouraged trial of tumeric. OK for continued tramadol - will return for controlled substance contract visit per policy.         Other   Hyperlipidemia    Reports good control. Will check lipids, cont atorvastatin      Relevant Medications   amLODipine (NORVASC) 10 MG tablet   atorvastatin (LIPITOR) 20 MG tablet  chlorthalidone (HYGROTON) 25 MG tablet   doxazosin (CARDURA) 2 MG tablet   sildenafil (VIAGRA) 100 MG tablet   Other Relevant Orders   Lipid panel   Erectile dysfunction    Ok response to viagra. Denies cp or sob      Relevant Medications   sildenafil (VIAGRA) 100 MG tablet   Morbid obesity (HCC)    Pt resistant to change. Knows he needs to lose weight but low motivation. Encouraged heart healthy diet and weight loss to help with pain. Not interested in surgery due to having a friend die during the procedure      Chronic narcotic use    Discussed risk of tramadol + flexeril in combination and advised against this due to sedation risk. Reports use for >10 years.        Other Visit Diagnoses    Screening for diabetes mellitus    -  Primary   Relevant Orders   Hemoglobin A1c       Return for before you run out of tramadol for refill .  Lesleigh Noe, MD  This visit occurred during the SARS-CoV-2 public health emergency.  Safety protocols were in place,  including screening questions prior to the visit, additional usage of staff PPE, and extensive cleaning of exam room while observing appropriate contact time as indicated for disinfecting solutions.

## 2019-09-07 NOTE — Assessment & Plan Note (Signed)
Reports good control. Will check lipids, cont atorvastatin

## 2019-09-07 NOTE — Patient Instructions (Signed)
Curcumin or Tumeric  Research has shown that Curcumin (Tumeric) supplements are just as good as high dose anti-inflammatory medications (like diclofenac and ibuprofen) at treating pain from osteoarthritis without the side effects.   Take Curcumin 500 mg Three times daily   -- You can find a bottle at [employer] for $8 which should last a month -- [place] Brand is around $9 and is available at [facility]  

## 2019-09-08 ENCOUNTER — Encounter: Payer: Self-pay | Admitting: Family Medicine

## 2019-09-08 DIAGNOSIS — R7303 Prediabetes: Secondary | ICD-10-CM | POA: Insufficient documentation

## 2019-09-19 ENCOUNTER — Encounter: Payer: Self-pay | Admitting: Family Medicine

## 2019-09-19 ENCOUNTER — Other Ambulatory Visit: Payer: Self-pay

## 2019-09-19 ENCOUNTER — Ambulatory Visit: Payer: Commercial Managed Care - PPO | Admitting: Family Medicine

## 2019-09-19 VITALS — BP 120/76 | HR 87 | Temp 98.3°F | Ht 66.0 in | Wt 288.8 lb

## 2019-09-19 DIAGNOSIS — F1190 Opioid use, unspecified, uncomplicated: Secondary | ICD-10-CM

## 2019-09-19 DIAGNOSIS — M1712 Unilateral primary osteoarthritis, left knee: Secondary | ICD-10-CM

## 2019-09-19 DIAGNOSIS — M545 Low back pain: Secondary | ICD-10-CM

## 2019-09-19 DIAGNOSIS — N529 Male erectile dysfunction, unspecified: Secondary | ICD-10-CM

## 2019-09-19 DIAGNOSIS — G8929 Other chronic pain: Secondary | ICD-10-CM

## 2019-09-19 MED ORDER — SILDENAFIL CITRATE 100 MG PO TABS: 100.0000 mg | ORAL_TABLET | ORAL | 0 refills | Status: DC | PRN

## 2019-09-19 MED ORDER — TRAMADOL HCL 50 MG PO TABS
50.0000 mg | ORAL_TABLET | Freq: Two times a day (BID) | ORAL | 5 refills | Status: DC
Start: 2019-09-19 — End: 2020-03-19

## 2019-09-19 NOTE — Assessment & Plan Note (Signed)
Controlled on tramadol 50 mg BID which he takes in the evening after work. Discussed no escalation and he acknowledge that is not his goal. Will continue to work on daytime pain control with tumeric.

## 2019-09-19 NOTE — Patient Instructions (Signed)
Return in 6 months for refill  Could consider taking tumeric 3 times a day (if you remember a lunchtime dose)

## 2019-09-19 NOTE — Progress Notes (Signed)
Subjective:     Scott York is a 54 y.o. male presenting for Medication Refill (tramadol)     HPI   #Chronic pain - taking the tramadol at nighttime primarily - works through pain at night - no issues with anxiety or depression   #ED - would like to have more pills - no cp - BP at goal  Review of Systems   Social History   Tobacco Use  Smoking Status Former Smoker  . Types: Cigars  . Quit date: 04/30/2011  . Years since quitting: 8.3  Smokeless Tobacco Never Used        Objective:    BP Readings from Last 3 Encounters:  09/19/19 120/76  09/07/19 138/80  02/08/19 138/74   Wt Readings from Last 3 Encounters:  09/19/19 288 lb 12 oz (131 kg)  09/07/19 285 lb (129.3 kg)  02/08/19 285 lb (129.3 kg)    BP 120/76   Pulse 87   Temp 98.3 F (36.8 C) (Temporal)   Ht 5\' 6"  (1.676 m)   Wt 288 lb 12 oz (131 kg)   SpO2 98%   BMI 46.61 kg/m    Physical Exam Constitutional:      Appearance: Normal appearance. He is not ill-appearing or diaphoretic.  HENT:     Right Ear: External ear normal.     Left Ear: External ear normal.     Nose: Nose normal.  Eyes:     General: No scleral icterus.    Extraocular Movements: Extraocular movements intact.     Conjunctiva/sclera: Conjunctivae normal.  Cardiovascular:     Rate and Rhythm: Normal rate and regular rhythm.     Heart sounds: No murmur heard.   Pulmonary:     Effort: Pulmonary effort is normal. No respiratory distress.     Breath sounds: Normal breath sounds. No wheezing.  Musculoskeletal:     Cervical back: Neck supple.  Skin:    General: Skin is warm and dry.  Neurological:     Mental Status: He is alert. Mental status is at baseline.  Psychiatric:        Mood and Affect: Mood normal.           Assessment & Plan:   Problem List Items Addressed This Visit      Musculoskeletal and Integument   Primary osteoarthritis of left knee - Primary (Chronic)   Relevant Medications   traMADol  (ULTRAM) 50 MG tablet   Other Relevant Orders   DRUG MONITORING, PANEL 8 WITH CONFIRMATION, URINE     Other   Erectile dysfunction    Requesting more pills - previously had 6/month. Discussed as cost low could send more so he has to fill less often. No cp, bp well controlled.       Relevant Medications   sildenafil (VIAGRA) 100 MG tablet   Chronic narcotic use    Controlled on tramadol 50 mg BID which he takes in the evening after work. Discussed no escalation and he acknowledge that is not his goal. Will continue to work on daytime pain control with tumeric.       Chronic low back pain   Relevant Medications   traMADol (ULTRAM) 50 MG tablet       Return in about 6 months (around 03/18/2020).  Lesleigh Noe, MD  This visit occurred during the SARS-CoV-2 public health emergency.  Safety protocols were in place, including screening questions prior to the visit, additional usage of staff PPE, and extensive  cleaning of exam room while observing appropriate contact time as indicated for disinfecting solutions.

## 2019-09-19 NOTE — Assessment & Plan Note (Signed)
Requesting more pills - previously had 6/month. Discussed as cost low could send more so he has to fill less often. No cp, bp well controlled.

## 2019-09-20 LAB — DM TEMPLATE

## 2019-09-20 LAB — DRUG MONITORING, PANEL 8 WITH CONFIRMATION, URINE
6 Acetylmorphine: NEGATIVE ng/mL (ref <10)
Alcohol Metabolites: NEGATIVE ng/mL
Amphetamines: NEGATIVE ng/mL (ref <500)
Benzodiazepines: NEGATIVE ng/mL (ref <100)
Buprenorphine, Urine: NEGATIVE ng/mL (ref <5)
Cocaine Metabolite: NEGATIVE ng/mL (ref <150)
Creatinine: 274.6 mg/dL
MDMA: NEGATIVE ng/mL (ref <500)
Marijuana Metabolite: NEGATIVE ng/mL (ref <20)
Opiates: NEGATIVE ng/mL (ref <100)
Oxidant: NEGATIVE ug/mL
Oxycodone: NEGATIVE ng/mL (ref <100)
pH: 6.9 (ref 4.5–9.0)

## 2019-10-07 ENCOUNTER — Telehealth: Payer: Self-pay | Admitting: Adult Health

## 2019-10-07 NOTE — Telephone Encounter (Signed)
Pt called, voice is strained, if I talk for a while I have to struggle to talk.  Gotten worse in the last month. Could the CPAP machine cause my voice issues? Would like a call from the nurse.

## 2019-10-10 NOTE — Telephone Encounter (Signed)
Called pt.  He states that he is having issues with voice raspiness (comes and goes) for the last 3 months.  Questioning whether cpap could cause this (after looking up on internet).  He has been using cpap for little over a year.    I relayed that not a issue that I have heard of, but will get MM.NP input and let him know.  May require pcp, referral to ENT.  He wanted to start with [place] first.

## 2019-10-12 NOTE — Telephone Encounter (Signed)
I called pt and relayed that per MM/NP that sx he is experiencing is not typical for cpap users.  He may increase humidity to see if helps, otherwise to move forward with pcp to evaluate.  He appreciated call back and will move forward.

## 2019-10-12 NOTE — Telephone Encounter (Signed)
This is not a typical complaint.  However he could adjust the humidity as more moisture may be helpful

## 2019-11-09 ENCOUNTER — Telehealth: Payer: Self-pay | Admitting: *Deleted

## 2019-11-09 NOTE — Telephone Encounter (Signed)
Patient's wife called stating that her husband has been having episodes of feeling like his throat is closing up and some SOB. Patient's wife stated that he has had a cough off and on for about a week. Patient's wife stated that he has not had his covid vaccines. Patient's wife stated that her husband is at work and is not having the problem at this time. Advised patient's wife that if his throat starts swelling up and he can not breath he needs to call 911. Advised patient's wife that he needs to go to an urgent care if he is not having an episode with his throat swelling and should probably go to an urgent care that has x-ray equipment in case he needs a chest x-ray. Patient's wife stated that she will call her husband and let him know what was recommended. Patient's wife stated that they are familiar with the [facility] at [facility] and will have her husband go to that facility for evaluation. ER precautions given and patients wife verbalized understanding.

## 2019-11-09 NOTE — Telephone Encounter (Signed)
Agree with evaluation and recommendations.

## 2019-11-10 ENCOUNTER — Other Ambulatory Visit: Payer: Self-pay

## 2019-11-10 ENCOUNTER — Ambulatory Visit
Admission: EM | Admit: 2019-11-10 | Discharge: 2019-11-10 | Disposition: A | Discharge status: Home / Self Care | Payer: Commercial Managed Care - PPO | Attending: Emergency Medicine | Admitting: Emergency Medicine

## 2019-11-10 DIAGNOSIS — R0989 Other specified symptoms and signs involving the circulatory and respiratory systems: Secondary | ICD-10-CM

## 2019-11-10 DIAGNOSIS — R198 Other specified symptoms and signs involving the digestive system and abdomen: Secondary | ICD-10-CM

## 2019-11-10 MED ORDER — ALUM & MAG HYDROXIDE-SIMETH 200-200-20 MG/5ML PO SUSP
30.0000 mL | Freq: Once | ORAL | Status: AC
  Administered 2019-11-10: 30 mL via ORAL

## 2019-11-10 MED ORDER — LIDOCAINE VISCOUS HCL 2 % MT SOLN
15.0000 mL | Freq: Once | OROMUCOSAL | Status: AC
  Administered 2019-11-10: 15 mL via ORAL

## 2019-11-10 NOTE — ED Provider Notes (Signed)
[facility]    CSN: [id number] Arrival date & time: 11/10/19  1504      History   Chief Complaint Chief Complaint  Patient presents with  . Sore Throat    lump and tenses up x 2 weeks    HPI Scott York is a 54 y.o. male  Presenting for globus sensation x10 days.  States he feels this is preceded by an episode of "throat closing".  Denies difficulty breathing, chest pain, palpitations, fever, recent illness.  Former smoker.  Has not been evaluated for this previously.  No choking, drooling.  Past Medical History:  Diagnosis Date  . Cholelithiasis   . DDD (degenerative disc disease), lumbar    L5-S1  . Hyperlipidemia   . Hypertension   . Obesity   . UTI (lower urinary tract infection)     Patient Active Problem List   Diagnosis Date Noted  . Chronic low back pain 09/19/2019  . Prediabetes 09/08/2019  . OSA on CPAP 09/07/2019  . Hypertension 09/07/2019  . Hyperlipidemia 09/07/2019  . Achilles tendon tear 09/07/2019  . Erectile dysfunction 09/07/2019  . Morbid obesity ([place]) 09/07/2019  . Chronic narcotic use 09/07/2019  . Primary osteoarthritis of left knee 07/16/2015    Past Surgical History:  Procedure Laterality Date  . APPENDECTOMY  04/20/2011   Procedure: APPENDECTOMY;  Surgeon: Rolm Bookbinder, MD;  Location: [facility];  Service: General;  Laterality: N/A;  Opened @1855 .  . FRACTURE SURGERY Right    Clavicle  . JOINT REPLACEMENT N/A    Phreesia 07/04/2019  . KNEE ARTHROSCOPY W/ MENISCAL REPAIR Left   . NOSE SURGERY    . TONSILLECTOMY    . TOTAL KNEE ARTHROPLASTY Left 07/16/2015   Procedure: TOTAL KNEE ARTHROPLASTY;  Surgeon: Dorna Leitz, MD;  Location: [facility];  Service: Orthopedics;  Laterality: Left;  . WISDOM TOOTH EXTRACTION         Home Medications    Prior to Admission medications   Medication Sig Start Date End Date Taking? Authorizing Provider  amLODipine (NORVASC) 10 MG tablet Take 1 tablet (10 mg total) by mouth daily.  09/07/19  Yes Lesleigh Noe, MD  aspirin EC 81 MG tablet Take 81 mg by mouth daily.   Yes [provider]  atorvastatin (LIPITOR) 20 MG tablet Take 1 tablet (20 mg total) by mouth daily. 09/07/19  Yes Lesleigh Noe, MD  chlorthalidone (HYGROTON) 25 MG tablet Take 1 tablet (25 mg total) by mouth daily. 09/07/19  Yes Lesleigh Noe, MD  cyclobenzaprine (FLEXERIL) 10 MG tablet Take 10 mg by mouth 2 (two) times daily. Take 1 at dinner time and 1 at bedtime.   Yes [provider]  Multiple Vitamin (MULITIVITAMIN WITH MINERALS) TABS Take 1 tablet by mouth daily.   Yes [provider]  OMEGA 3 1200 MG CAPS Take 1,200 mg by mouth daily.    Yes [provider]  traMADol (ULTRAM) 50 MG tablet Take 1 tablet (50 mg total) by mouth 2 (two) times daily. Take 1 at dinner time and 1 at bedtime 09/19/19  Yes Lesleigh Noe, MD  UNABLE TO FIND CPAP machine- use nightly for sleep apnea   Yes [provider]  doxazosin (CARDURA) 2 MG tablet Take 1 tablet (2 mg total) by mouth daily. 09/07/19   Lesleigh Noe, MD  sildenafil (VIAGRA) 100 MG tablet Take 1 tablet (100 mg total) by mouth as needed for erectile dysfunction. 09/19/19   Lesleigh Noe,  MD  Turmeric 500 MG CAPS Take by mouth in the morning and at bedtime.    [provider]    Family History Family History  Problem Relation Age of Onset  . Heart attack Mother 50    Social History Social History   Tobacco Use  . Smoking status: Former Smoker    Types: Cigars    Quit date: 04/30/2011    Years since quitting: 8.5  . Smokeless tobacco: Never Used  Vaping Use  . Vaping Use: Never used  Substance Use Topics  . Alcohol use: Yes  . Drug use: No     Allergies   No known allergies   Review of Systems Review of Systems   Physical Exam Triage Vital Signs ED Triage Vitals  Enc Vitals Group     BP 11/10/19 1533 (!) 145/84     Pulse Rate 11/10/19 1533 74     Resp 11/10/19 1533 19     Temp  11/10/19 1533 98.2 F (36.8 C)     Temp Source 11/10/19 1533 Oral     SpO2 11/10/19 1533 96 %     Weight --      Height --      Head Circumference --      Peak Flow --      Pain Score 11/10/19 1536 0     Pain Loc --      Pain Edu? --      Excl. in [place]? --    No data found.  Updated Vital Signs BP (!) 145/84 (BP Location: Right Arm)   Pulse 74   Temp 98.2 F (36.8 C) (Oral)   Resp 19   SpO2 96%   Visual Acuity Right Eye Distance:   Left Eye Distance:   Bilateral Distance:    Right Eye Near:   Left Eye Near:    Bilateral Near:     Physical Exam Constitutional:      General: He is not in acute distress.    Appearance: He is well-developed. He is obese. He is not ill-appearing.  HENT:     Head: Normocephalic and atraumatic.     Mouth/Throat:     Mouth: Mucous membranes are moist.     Pharynx: Oropharynx is clear. Uvula midline. No posterior oropharyngeal erythema or uvula swelling.     Tonsils: No tonsillar exudate.  Eyes:     General: No scleral icterus.    Conjunctiva/sclera: Conjunctivae normal.     Pupils: Pupils are equal, round, and reactive to light.  Neck:     Comments: Trachea midline, negative JVD. No thyromegaly, nodules, crepitus. Cardiovascular:     Rate and Rhythm: Normal rate.  Pulmonary:     Effort: Pulmonary effort is normal. No respiratory distress.     Breath sounds: No wheezing.  Lymphadenopathy:     Cervical: No cervical adenopathy.  Skin:    Coloration: Skin is not jaundiced or pale.  Neurological:     Mental Status: He is alert and oriented to person, place, and time.      UC Treatments / Results  Labs (all labs ordered are listed, but only abnormal results are displayed) Labs Reviewed - No data to display  EKG   Radiology No results found.  Procedures Procedures (including critical care time)  Medications Ordered in UC Medications  alum & mag hydroxide-simeth (MAALOX/MYLANTA) 200-200-20 MG/5ML suspension 30 mL (has no  administration in time range)    And  lidocaine (XYLOCAINE) 2 % viscous  mouth solution 15 mL (has no administration in time range)    Initial Impression / Assessment and Plan / UC Course  I have reviewed the triage vital signs and the nursing notes.  Pertinent labs & imaging results that were available during my care of the patient were reviewed by me and considered in my medical decision making (see chart for details).     No airway compromise, and patient is in no acute distress.  Exam unremarkable.  Will trial GI cocktail as above, follow-up with ENT/PCP for further evaluation given smoking history.  Return precautions discussed, pt verbalized understanding and is agreeable to plan Final Clinical Impressions(s) / UC Diagnoses   Final diagnoses:  Globus sensation   Discharge Instructions   None    ED Prescriptions    None     PDMP not reviewed this encounter.   Hall-Potvin, [place], [place] 11/10/19 1619

## 2019-11-10 NOTE — ED Triage Notes (Signed)
Pt states he had an incident where his throat felt like it was closing about 10 days ago and again a few days ago. Pt also states he has felt like there is a lump in his throat for several days and it worsens with talking. Pt is aox4 and ambulatory.

## 2020-02-09 ENCOUNTER — Telehealth: Payer: Commercial Managed Care - PPO | Admitting: Adult Health

## 2020-03-19 ENCOUNTER — Other Ambulatory Visit: Payer: Self-pay

## 2020-03-19 ENCOUNTER — Ambulatory Visit: Payer: Commercial Managed Care - PPO | Admitting: Family Medicine

## 2020-03-19 VITALS — BP 120/70 | HR 78 | Temp 98.4°F | Ht 66.0 in | Wt 286.0 lb

## 2020-03-19 DIAGNOSIS — I10 Essential (primary) hypertension: Secondary | ICD-10-CM | POA: Diagnosis not present

## 2020-03-19 DIAGNOSIS — F1190 Opioid use, unspecified, uncomplicated: Secondary | ICD-10-CM

## 2020-03-19 DIAGNOSIS — M1712 Unilateral primary osteoarthritis, left knee: Secondary | ICD-10-CM | POA: Diagnosis not present

## 2020-03-19 MED ORDER — TRAMADOL HCL 50 MG PO TABS: 50.0000 mg | ORAL_TABLET | Freq: Two times a day (BID) | ORAL | 5 refills | Status: DC | PRN

## 2020-03-19 NOTE — Progress Notes (Signed)
Subjective:     Scott York is a 55 y.o. male presenting for Follow-up (6 month)     HPI  #HTN - home readings 132/80s  - no cp, sob, ha - bp is typically higher in the office  #Pain - doing well on the tramadol in the evenings - knees and ankles - hx of knee replacement and still having pain on that side - weight does not help the symptoms   Review of Systems   Social History   Tobacco Use  Smoking Status Former Smoker  . Types: Cigars  . Quit date: 04/30/2011  . Years since quitting: 8.8  Smokeless Tobacco Never Used        Objective:    BP Readings from Last 3 Encounters:  03/19/20 120/70  11/10/19 (!) 145/84  09/19/19 120/76   Wt Readings from Last 3 Encounters:  03/19/20 286 lb (129.7 kg)  09/19/19 288 lb 12 oz (131 kg)  09/07/19 285 lb (129.3 kg)    BP 120/70   Pulse 78   Temp 98.4 F (36.9 C) (Temporal)   Ht 5\' 6"  (1.676 m)   Wt 286 lb (129.7 kg)   SpO2 97%   BMI 46.16 kg/m    Physical Exam Constitutional:      Appearance: Normal appearance. He is not ill-appearing or diaphoretic.  HENT:     Right Ear: External ear normal.     Left Ear: External ear normal.  Eyes:     General: No scleral icterus.    Extraocular Movements: Extraocular movements intact.     Conjunctiva/sclera: Conjunctivae normal.  Cardiovascular:     Rate and Rhythm: Normal rate and regular rhythm.     Heart sounds: No murmur heard.   Pulmonary:     Effort: Pulmonary effort is normal. No respiratory distress.     Breath sounds: Normal breath sounds. No wheezing.  Musculoskeletal:     Cervical back: Neck supple.  Skin:    General: Skin is warm and dry.  Neurological:     Mental Status: He is alert. Mental status is at baseline.  Psychiatric:        Mood and Affect: Mood normal.           Assessment & Plan:   Problem List Items Addressed This Visit      Cardiovascular and Mediastinum   Hypertension    BP at goal. Cont doxazosin 2 mg daily,  chlorthalidone 25 mg daily, and amlodipine 10 mg daily.         Musculoskeletal and Integument   Primary osteoarthritis of left knee - Primary (Chronic)    Still with pain even after knee replacement. Cont tramadol.       Relevant Medications   traMADol (ULTRAM) 50 MG tablet     Other   Chronic narcotic use    Reviewed refills. Pt reports taking 2 pills daily, however, has not refilled #30 supply since January. Reports does not always need 2 pills. Discussed continuing once daily supply and if/when he runs out he can reach out. But would not go back to #60 for 1 month as it seems he does not need this amount based on last 5 months of refills. Cont tramadol 50 mg 1-2 pills per day.       Relevant Medications   traMADol (ULTRAM) 50 MG tablet       Return in about 6 months (around 09/19/2020) for annual and med refill .  Lesleigh Noe, MD  This visit occurred during the SARS-CoV-2 public health emergency.  Safety protocols were in place, including screening questions prior to the visit, additional usage of staff PPE, and extensive cleaning of exam room while observing appropriate contact time as indicated for disinfecting solutions.

## 2020-03-19 NOTE — Assessment & Plan Note (Signed)
Still with pain even after knee replacement. Cont tramadol.

## 2020-03-19 NOTE — Assessment & Plan Note (Signed)
BP at goal. Cont doxazosin 2 mg daily, chlorthalidone 25 mg daily, and amlodipine 10 mg daily.

## 2020-03-19 NOTE — Assessment & Plan Note (Signed)
Reviewed refills. Pt reports taking 2 pills daily, however, has not refilled #30 supply since January. Reports does not always need 2 pills. Discussed continuing once daily supply and if/when he runs out he can reach out. But would not go back to #60 for 1 month as it seems he does not need this amount based on last 5 months of refills. Cont tramadol 50 mg 1-2 pills per day.

## 2020-03-19 NOTE — Patient Instructions (Signed)
Call if you run out of medication  Return in 6 months for annual and medication refill

## 2020-03-28 ENCOUNTER — Emergency Department (HOSPITAL_COMMUNITY)
Admission: EM | Admit: 2020-03-28 | Discharge: 2020-03-28 | Disposition: A | Discharge status: Home / Self Care | Payer: Commercial Managed Care - PPO | Attending: Emergency Medicine | Admitting: Emergency Medicine

## 2020-03-28 ENCOUNTER — Encounter (HOSPITAL_COMMUNITY): Payer: Self-pay

## 2020-03-28 ENCOUNTER — Other Ambulatory Visit: Payer: Self-pay

## 2020-03-28 DIAGNOSIS — Z96652 Presence of left artificial knee joint: Secondary | ICD-10-CM | POA: Diagnosis not present

## 2020-03-28 DIAGNOSIS — K047 Periapical abscess without sinus: Secondary | ICD-10-CM | POA: Insufficient documentation

## 2020-03-28 DIAGNOSIS — K0889 Other specified disorders of teeth and supporting structures: Secondary | ICD-10-CM | POA: Diagnosis present

## 2020-03-28 DIAGNOSIS — I10 Essential (primary) hypertension: Secondary | ICD-10-CM | POA: Insufficient documentation

## 2020-03-28 DIAGNOSIS — K029 Dental caries, unspecified: Secondary | ICD-10-CM | POA: Diagnosis not present

## 2020-03-28 DIAGNOSIS — Z79899 Other long term (current) drug therapy: Secondary | ICD-10-CM | POA: Insufficient documentation

## 2020-03-28 DIAGNOSIS — Z87891 Personal history of nicotine dependence: Secondary | ICD-10-CM | POA: Insufficient documentation

## 2020-03-28 MED ORDER — AMOXICILLIN-POT CLAVULANATE 875-125 MG PO TABS
1.0000 | ORAL_TABLET | Freq: Once | ORAL | Status: AC
  Administered 2020-03-28: 1 via ORAL
  Filled 2020-03-28: qty 1

## 2020-03-28 MED ORDER — AMOXICILLIN-POT CLAVULANATE 875-125 MG PO TABS: 1.0000 | ORAL_TABLET | Freq: Two times a day (BID) | ORAL | 0 refills | Status: AC

## 2020-03-28 NOTE — Discharge Instructions (Signed)
You are seen in the emergency room today for your dental infection.  You have been prescribed an antibiotic to take at home.  Please take entire course of antibiotics as directed.  Continue using ibuprofen / Tylenol, as well as topical Orajel for pain.  You will need to follow-up with your dentist for continued management of this. Please see dental resources below. Return to the emergency department for fevers, swelling or pain under the tongue or in the neck, difficulty breathing or swallowing, nausea or vomiting that does not stop, or any other new or concerning symptoms.

## 2020-03-28 NOTE — ED Triage Notes (Signed)
Patient here with complaint of dental abscess to left upper jaw. Reports broken tooth with increased pain today.

## 2020-03-28 NOTE — ED Provider Notes (Signed)
[facility] EM[facility] DEPARTMENT Provider Note   CSN: [id number] Arrival date & time: 03/28/20  1426     History No chief complaint on file.   Scott York is a 55 y.o. male who presents with 48 hours of left upper dental pain and swelling.  Patient recently cracked a tooth while chewing on hard candy; he pulled a half of the broken tooth out of his mouth using a pair of pliers at home, but did not pulled the other half due to pain.  He called the oral surgeon this morning, however they are unable to see him for another week.  Presented to the ER at the prompting.  He endorses fever last night of 101 F, treated with Tylenol and ibuprofen.  He denies any nausea, vomiting, diarrhea, headache, blurry vision, double vision, chest pain, shortness of breath and palpitations.  Denies throat swelling or difficulty swallowing. I personally read this patient's medical records.  Has history of hypertension, OSA, DDD, Hyperlipidemia,    HPI     Past Medical History:  Diagnosis Date  . Cholelithiasis   . DDD (degenerative disc disease), lumbar    L5-S1  . Hyperlipidemia   . Hypertension   . Obesity   . UTI (lower urinary tract infection)     Patient Active Problem List   Diagnosis Date Noted  . Chronic low back pain 09/19/2019  . Prediabetes 09/08/2019  . OSA on CPAP 09/07/2019  . Hypertension 09/07/2019  . Hyperlipidemia 09/07/2019  . Achilles tendon tear 09/07/2019  . Erectile dysfunction 09/07/2019  . Morbid obesity ([place]) 09/07/2019  . Chronic narcotic use 09/07/2019  . Primary osteoarthritis of left knee 07/16/2015    Past Surgical History:  Procedure Laterality Date  . APPENDECTOMY  04/20/2011   Procedure: APPENDECTOMY;  Surgeon: Rolm Bookbinder, MD;  Location: [facility];  Service: General;  Laterality: N/A;  Opened @1855 .  [facility] FRACTURE SURGERY Right    Clavicle  . JOINT REPLACEMENT N/A    Phreesia 07/04/2019  . KNEE ARTHROSCOPY W/ MENISCAL REPAIR Left    . NOSE SURGERY    . TONSILLECTOMY    . TOTAL KNEE ARTHROPLASTY Left 07/16/2015   Procedure: TOTAL KNEE ARTHROPLASTY;  Surgeon: Dorna Leitz, MD;  Location: [facility];  Service: Orthopedics;  Laterality: Left;  . WISDOM TOOTH EXTRACTION         Family History  Problem Relation Age of Onset  . Heart attack Mother 37    Social History   Tobacco Use  . Smoking status: Former Smoker    Types: Cigars    Quit date: 04/30/2011    Years since quitting: 8.9  . Smokeless tobacco: Never Used  Vaping Use  . Vaping Use: Never used  Substance Use Topics  . Alcohol use: Yes  . Drug use: No    Home Medications Prior to Admission medications   Medication Sig Start Date End Date Taking? Authorizing Provider  amLODipine (NORVASC) 10 MG tablet Take 1 tablet (10 mg total) by mouth daily. 09/07/19  Yes Lesleigh Noe, MD  amoxicillin-clavulanate (AUGMENTIN) 875-125 MG tablet Take 1 tablet by mouth 2 (two) times daily for 10 days. 03/28/20 04/07/20 Yes Sponseller, Gypsy Balsam, PA-C  aspirin EC 81 MG tablet Take 81 mg by mouth daily.   Yes [provider]  atorvastatin (LIPITOR) 20 MG tablet Take 1 tablet (20 mg total) by mouth daily. 09/07/19  Yes Lesleigh Noe, MD  chlorthalidone (HYGROTON) 25 MG tablet Take 1 tablet (25  mg total) by mouth daily. 09/07/19  Yes Lesleigh Noe, MD  cyclobenzaprine (FLEXERIL) 10 MG tablet Take 10 mg by mouth at bedtime as needed for muscle spasms.   Yes [provider]  doxazosin (CARDURA) 2 MG tablet Take 1 tablet (2 mg total) by mouth daily. 09/07/19  Yes Lesleigh Noe, MD  Multiple Vitamin (MULITIVITAMIN WITH MINERALS) TABS Take 1 tablet by mouth daily.   Yes [provider]  naproxen sodium (ALEVE) 220 MG tablet Take 220 mg by mouth 2 (two) times daily as needed (for pain).   Yes [provider]  OMEGA 3 1200 MG CAPS Take 1,200 mg by mouth daily.    Yes [provider]  traMADol (ULTRAM) 50 MG tablet Take 1 tablet (50 mg total) by  mouth 2 (two) times daily as needed for severe pain. Take 1 at dinner time and 1 at bedtime Patient taking differently: Take 50 mg by mouth at bedtime as needed (for pain). 03/19/20  Yes Lesleigh Noe, MD  UNABLE TO FIND CPAP machine- At bedtime   Yes [provider]  sildenafil (VIAGRA) 100 MG tablet Take 1 tablet (100 mg total) by mouth as needed for erectile dysfunction. Patient not taking: Reported on 03/28/2020 09/19/19   Lesleigh Noe, MD    Allergies    Turmeric  Review of Systems   Review of Systems  Constitutional: Positive for chills and fever. Negative for appetite change, diaphoresis and fatigue.  HENT: Positive for dental problem and facial swelling. Negative for sore throat, trouble swallowing and voice change.   Eyes: Negative.   Respiratory: Negative.   Cardiovascular: Negative.   Gastrointestinal: Negative.   Skin: Negative.     Physical Exam Updated Vital Signs BP (!) 147/80 (BP Location: Right Arm)   Pulse 93   Temp 99.5 F (37.5 C) (Oral)   Resp 20   SpO2 95%   Physical Exam Vitals and nursing note reviewed.  Constitutional:      Appearance: He is obese.  HENT:     Head: Normocephalic and atraumatic.      Nose: Nose normal. No congestion.     Mouth/Throat:     Mouth: Mucous membranes are moist.     Dentition: Abnormal dentition. Dental tenderness and dental caries present.     Tongue: No lesions. Tongue does not deviate from midline.     Palate: No mass and lesions.     Pharynx: Oropharynx is clear. Uvula midline. No oropharyngeal exudate.     Tonsils: No tonsillar exudate.      Comments: Extensive dental caries, fractured tooth.  No sublingual or submental tenderness to palpation, no sign of oropharyngeal abscess or Ludwig's angina. Eyes:     General: No scleral icterus.       Right eye: No discharge.        Left eye: No discharge.     Extraocular Movements: Extraocular movements intact.     Conjunctiva/sclera: Conjunctivae normal.      Pupils: Pupils are equal, round, and reactive to light.  Cardiovascular:     Rate and Rhythm: Normal rate and regular rhythm.     Pulses: Normal pulses.     Heart sounds: Normal heart sounds. No murmur heard.   Pulmonary:     Effort: Pulmonary effort is normal.     Breath sounds: Normal breath sounds.  Chest:     Chest wall: No swelling, tenderness, crepitus or edema.  Abdominal:     Palpations: Abdomen  is soft.     Tenderness: There is no abdominal tenderness. There is no right CVA tenderness or left CVA tenderness.  Skin:    General: Skin is warm and dry.  Neurological:     General: No focal deficit present.     Mental Status: He is alert.  Psychiatric:        Mood and Affect: Mood normal.     ED Results / Procedures / Treatments   Labs (all labs ordered are listed, but only abnormal results are displayed) Labs Reviewed - No data to display  EKG None  Radiology No results found.  Procedures Procedures   Medications Ordered in ED Medications  amoxicillin-clavulanate (AUGMENTIN) 875-125 MG per tablet 1 tablet (1 tablet Oral Given 03/28/20 1622)    ED Course  I have reviewed the triage vital signs and the nursing notes.  Pertinent labs & imaging results that were available during my care of the patient were reviewed by me and considered in my medical decision making (see chart for details).    MDM Rules/Calculators/A&P                         55 year old male presents with concern for dental pain and facial swelling x48 hours after recent fractured tooth.  He extracted one half of the broken tooth at home using pair pliers  Hypertensive on intake vitals otherwise normal.  Cardiopulmonary exam is normal, abdominal exam is benign.  Facial exam revealed mild swelling over the left maxilla without fluctuance crepitus, erythema.  There are multiple dental caries, as well as a fractured tooth in the left posterior maxilla with surrounding buccal mucosa erythema, and  tenderness to palpation. No sign of Ludwig's angina or oropharyngeal abscess.  Presentation consistent with periapical infection.  Will administer first dose of antibiotics in the emergency department.  Will discharge with course of antibiotics and dental resources.  Recommend he follow-up in the next week with his dentist.  Aragon voiced understanding of his medical evaluation and treatment plan.  Each of his questions was answered to his expressed satisfaction.  Return precautions given.  Patient is well-appearing, stable, and appropriate for discharge at this time.  This chart was dictated using voice recognition software, Dragon. Despite the best efforts of this provider to proofread and correct errors, errors may still occur which can change documentation meaning.  Final Clinical Impression(s) / ED Diagnoses Final diagnoses:  Dental infection    Rx / DC Orders ED Discharge Orders         Ordered    amoxicillin-clavulanate (AUGMENTIN) 875-125 MG tablet  2 times daily        03/28/20 1613           Sponseller, Gypsy Balsam, PA-C 03/28/20 1624    Lorelle Gibbs, DO 03/29/20 1622

## 2020-05-03 ENCOUNTER — Telehealth: Payer: Self-pay

## 2020-05-03 NOTE — Telephone Encounter (Signed)
Wife called req refill on Amlodipine, advised to double check with the pharmacy as he should have Rx through 09/2020

## 2020-07-19 ENCOUNTER — Other Ambulatory Visit: Payer: Self-pay | Admitting: Family Medicine

## 2020-07-19 DIAGNOSIS — I10 Essential (primary) hypertension: Secondary | ICD-10-CM

## 2020-09-09 ENCOUNTER — Other Ambulatory Visit: Payer: Self-pay | Admitting: Family Medicine

## 2020-09-09 DIAGNOSIS — E782 Mixed hyperlipidemia: Secondary | ICD-10-CM

## 2020-09-11 NOTE — Telephone Encounter (Signed)
Please schedule pt for CPE. 90 day refill given.

## 2020-09-15 ENCOUNTER — Other Ambulatory Visit: Payer: Self-pay | Admitting: Family Medicine

## 2020-09-15 DIAGNOSIS — I10 Essential (primary) hypertension: Secondary | ICD-10-CM

## 2020-09-24 ENCOUNTER — Encounter: Payer: Commercial Managed Care - PPO | Admitting: Family Medicine

## 2020-10-08 ENCOUNTER — Other Ambulatory Visit: Payer: Self-pay

## 2020-10-08 ENCOUNTER — Ambulatory Visit (INDEPENDENT_AMBULATORY_CARE_PROVIDER_SITE_OTHER): Payer: Commercial Managed Care - PPO | Admitting: Family Medicine

## 2020-10-08 VITALS — BP 130/70 | HR 74 | Temp 97.0°F | Ht 64.1 in | Wt 273.4 lb

## 2020-10-08 DIAGNOSIS — Z1159 Encounter for screening for other viral diseases: Secondary | ICD-10-CM

## 2020-10-08 DIAGNOSIS — R7303 Prediabetes: Secondary | ICD-10-CM | POA: Diagnosis not present

## 2020-10-08 DIAGNOSIS — M1712 Unilateral primary osteoarthritis, left knee: Secondary | ICD-10-CM

## 2020-10-08 DIAGNOSIS — I10 Essential (primary) hypertension: Secondary | ICD-10-CM | POA: Diagnosis not present

## 2020-10-08 DIAGNOSIS — E782 Mixed hyperlipidemia: Secondary | ICD-10-CM

## 2020-10-08 DIAGNOSIS — F1190 Opioid use, unspecified, uncomplicated: Secondary | ICD-10-CM

## 2020-10-08 DIAGNOSIS — Z114 Encounter for screening for human immunodeficiency virus [HIV]: Secondary | ICD-10-CM

## 2020-10-08 DIAGNOSIS — Z0000 Encounter for general adult medical examination without abnormal findings: Secondary | ICD-10-CM

## 2020-10-08 DIAGNOSIS — Z1211 Encounter for screening for malignant neoplasm of colon: Secondary | ICD-10-CM

## 2020-10-08 LAB — COMPREHENSIVE METABOLIC PANEL
ALT: 30 U/L (ref 0–53)
AST: 19 U/L (ref 0–37)
Albumin: 4.2 g/dL (ref 3.5–5.2)
Alkaline Phosphatase: 83 U/L (ref 39–117)
BUN: 17 mg/dL (ref 6–23)
CO2: 24 mEq/L (ref 19–32)
Calcium: 9.7 mg/dL (ref 8.4–10.5)
Chloride: 105 mEq/L (ref 96–112)
Creatinine, Ser: 0.88 mg/dL (ref 0.40–1.50)
GFR: 96.55 mL/min (ref >60.00)
Glucose, Bld: 102 mg/dL — ABNORMAL HIGH (ref 70–99)
Potassium: 3.8 mEq/L (ref 3.5–5.1)
Sodium: 140 mEq/L (ref 135–145)
Total Bilirubin: 0.5 mg/dL (ref 0.2–1.2)
Total Protein: 6.8 g/dL (ref 6.0–8.3)

## 2020-10-08 LAB — CBC
HCT: 44.1 % (ref 39.0–52.0)
Hemoglobin: 14.6 g/dL (ref 13.0–17.0)
MCHC: 33 g/dL (ref 30.0–36.0)
MCV: 87.3 fl (ref 78.0–100.0)
Platelets: 241 10*3/uL (ref 150.0–400.0)
RBC: 5.05 Mil/uL (ref 4.22–5.81)
RDW: 14.1 % (ref 11.5–15.5)
WBC: 6.1 10*3/uL (ref 4.0–10.5)

## 2020-10-08 LAB — LIPID PANEL
Cholesterol: 113 mg/dL (ref 0–200)
HDL: 42.5 mg/dL (ref >39.00)
LDL Cholesterol: 51 mg/dL (ref 0–99)
NonHDL: 70.24
Total CHOL/HDL Ratio: 3
Triglycerides: 97 mg/dL (ref 0.0–149.0)
VLDL: 19.4 mg/dL (ref 0.0–40.0)

## 2020-10-08 LAB — HEMOGLOBIN A1C: Hgb A1c MFr Bld: 5.5 % (ref 4.6–6.5)

## 2020-10-08 MED ORDER — DOXAZOSIN MESYLATE 2 MG PO TABS: 2.0000 mg | ORAL_TABLET | Freq: Every day | ORAL | 3 refills | Status: DC

## 2020-10-08 MED ORDER — CYCLOBENZAPRINE HCL 10 MG PO TABS: 10.0000 mg | ORAL_TABLET | Freq: Every evening | ORAL | 1 refills | Status: DC | PRN

## 2020-10-08 MED ORDER — TRAMADOL HCL 50 MG PO TABS: 50.0000 mg | ORAL_TABLET | Freq: Every evening | ORAL | 5 refills | Status: DC | PRN

## 2020-10-08 MED ORDER — AMLODIPINE BESYLATE 10 MG PO TABS: 10.0000 mg | ORAL_TABLET | Freq: Every day | ORAL | 3 refills | Status: DC

## 2020-10-08 MED ORDER — ATORVASTATIN CALCIUM 20 MG PO TABS: 20.0000 mg | ORAL_TABLET | Freq: Every day | ORAL | 3 refills | Status: DC

## 2020-10-08 NOTE — Patient Instructions (Addendum)
Shingles - 2 part and can cause 1-2 days of body aches, fatigue  Preventive Care 16-56 Years Old, Male Preventive care refers to lifestyle choices and visits with your health care provider that can promote health and wellness. This includes: A yearly physical exam. This is also called an annual wellness visit. Regular dental and eye exams. Immunizations. Screening for certain conditions. Healthy lifestyle choices, such as: Eating a healthy diet. Getting regular exercise. Not using drugs or products that contain nicotine and tobacco. Limiting alcohol use. What can I expect for my preventive care visit? Physical exam Your health care provider will check your: Height and weight. These may be used to calculate your BMI (body mass index). BMI is a measurement that tells if you are at a healthy weight. Heart rate and blood pressure. Body temperature. Skin for abnormal spots. Counseling Your health care provider may ask you questions about your: Past medical problems. Family's medical history. Alcohol, tobacco, and drug use. Emotional well-being. Home life and relationship well-being. Sexual activity. Diet, exercise, and sleep habits. Work and work Statistician. Access to firearms. What immunizations do I need? Vaccines are usually given at various ages, according to a schedule. Your health care provider will recommend vaccines for you based on your age, medical history, and lifestyle or other factors, such as travel or where you work. What tests do I need? Blood tests Lipid and cholesterol levels. These may be checked every 5 years, or more often if you are over 37 years old. Hepatitis C test. Hepatitis B test. Screening Lung cancer screening. You may have this screening every year starting at age 69 if you have a 30-pack-year history of smoking and currently smoke or have quit within the past 15 years. Prostate cancer screening. Recommendations will vary depending on your family  history and other risks. Genital exam to check for testicular cancer or hernias. Colorectal cancer screening. All adults should have this screening starting at age 49 and continuing until age 82. Your health care provider may recommend screening at age 78 if you are at increased risk. You will have tests every 1-10 years, depending on your results and the type of screening test. Diabetes screening. This is done by checking your blood sugar (glucose) after you have not eaten for a while (fasting). You may have this done every 1-3 years. STD (sexually transmitted disease) testing, if you are at risk. Follow these instructions at home: Eating and drinking  Eat a diet that includes fresh fruits and vegetables, whole grains, lean protein, and low-fat dairy products. Take vitamin and mineral supplements as recommended by your health care provider. Do not drink alcohol if your health care provider tells you not to drink. If you drink alcohol: Limit how much you have to 0-2 drinks a day. Be aware of how much alcohol is in your drink. In the [place] one drink equals one 12 oz bottle of beer (355 mL), one 5 oz glass of wine (148 mL), or one 1 oz glass of hard liquor (44 mL). Lifestyle Take daily care of your teeth and gums. Brush your teeth every morning and night with fluoride toothpaste. Floss one time each day. Stay active. Exercise for at least 30 minutes 5 or more days each week. Do not use any products that contain nicotine or tobacco, such as cigarettes, e-cigarettes, and chewing tobacco. If you need help quitting, ask your health care provider. Do not use drugs. If you are sexually active, practice safe sex. Use a condom or  other form of protection to prevent STIs (sexually transmitted infections). If told by your health care provider, take low-dose aspirin daily starting at age 72. Find healthy ways to cope with stress, such as: Meditation, yoga, or listening to music. Journaling. Talking  to a trusted person. Spending time with friends and family. Safety Always wear your seat belt while driving or riding in a vehicle. Do not drive: If you have been drinking alcohol. Do not ride with someone who has been drinking. When you are tired or distracted. While texting. Wear a helmet and other protective equipment during sports activities. If you have firearms in your house, make sure you follow all gun safety procedures. What's next? Go to your health care provider once a year for an annual wellness visit. Ask your health care provider how often you should have your eyes and teeth checked. Stay up to date on all vaccines. This information is not intended to replace advice given to you by your health care provider. Make sure you discuss any questions you have with your health care provider. Document Revised: 03/02/2020 Document Reviewed: 12/17/2017 Elsevier Patient Education  2022 [employer].

## 2020-10-08 NOTE — Progress Notes (Signed)
Annual Exam   Chief Complaint:  Chief Complaint  Patient presents with   Annual Exam    History of Present Illness:  Scott York is a 55 y.o. presents today for annual examination.     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  Any issues with getting or keeping erection? Yes  Social History   Tobacco Use  Smoking Status Former   Types: Cigars   Quit date: 04/30/2011   Years since quitting: 9.4  Smokeless Tobacco Never   Social History   Substance and Sexual Activity  Alcohol Use Yes   Social History   Substance and Sexual Activity  Drug Use No     Safety The patient wears seatbelts: yes.     The patient feels safe at home and in their relationships: yes.  General Health Dentist in the last year: Yes Eye doctor: yes  Weight Wt Readings from Last 3 Encounters:  10/08/20 273 lb 6 oz (124 kg)  03/19/20 286 lb (129.7 kg)  09/19/19 288 lb 12 oz (131 kg)   Patient has very high BMI  BMI Readings from Last 1 Encounters:  10/08/20 46.78 kg/m     Chronic disease screening Blood pressure monitoring:  BP Readings from Last 3 Encounters:  10/08/20 130/70  03/28/20 (!) 147/80  03/19/20 120/70    Lipid Monitoring: Indication for screening: age >35, obesity, diabetes, family hx, CV risk factors.  Lipid screening: Yes  Lab Results  Component Value Date   CHOL 122 09/07/2019   HDL 56.90 09/07/2019   LDLCALC 49 09/07/2019   TRIG 81.0 09/07/2019   CHOLHDL 2 09/07/2019     Diabetes Screening: age >56, overweight, family hx, PCOS, hx of gestational diabetes, at risk ethnicity, elevated blood pressure >135/80.  Diabetes Screening screening: Yes  Lab Results  Component Value Date   HGBA1C 5.7 09/07/2019     Prostate Cancer Screening: Yes Age 74-69 yo Shared Decision Making Higher Risk: Older age, African American, Family Hx  of Prostate Cancer - No Benefits: screening may prevent 1.3 deaths from prostate cancer over 13 years per 1000 men screened and prevent 3 metastatic cases per 1000 men screened. Not enough evidence to support more benefit for AA or Carney Harms: False Positive and psychological harms. 15% of me with false positive over a 2 to 4 year period > resulting in biopsy and complications such as pain, hematospermia, infections. Overdiagnosis - increases with age - found that 20-50% of prostate cancer through screening may have never caused any issues. Harms of treatment include - erectile dysfunction, urinary incontinence, and bothersome bowel symptoms.   After discussion he does want to get a PSA checked today.   Inadequate evidence for screening <55 No mortality benefit for screening >70   No results found for: PSA1, PSA     Colon Cancer Screening:  Age 110-75 yo - benefits outweigh the risk. Adults 61-85 yo who have never been screened benefit.  Benefits: 134000 people in 2016 will be diagnosed and 49,000 will die - early detection helps Harms: Complications 2/2 to colonoscopy High Risk (Colonoscopy): genetic disorder (Lynch syndrome or familial adenomatous polyposis), personal hx of IBD, previous adenomatous polyp, or previous colorectal cancer, FamHx start 10 years before the age at diagnosis, increased in males and black race  Options:  FIT - looks for hemoglobin (blood in the stool) - specific and fairly sensitive - must  be done annually Cologuard - looks for DNA and blood - more sensitive - therefore can have more false positives, every 3 years Colonoscopy - every 10 years if normal - sedation, bowl prep, must have someone drive you  Shared decision making and the patient had decided to do colonoscopy.   Social History   Tobacco Use  Smoking Status Former   Types: Cigars   Quit date: 04/30/2011   Years since quitting: 9.4  Smokeless Tobacco Never    Lung Cancer Screening (Ages 5-80):  no   Abdominal Aortic Aneurysm:  Age 73-75, 1 time screening, men who have ever smoked not yet old enough    Past Medical History:  Diagnosis Date   Cholelithiasis    DDD (degenerative disc disease), lumbar    L5-S1   Hyperlipidemia    Hypertension    Obesity    UTI (lower urinary tract infection)     Past Surgical History:  Procedure Laterality Date   APPENDECTOMY  04/20/2011   Procedure: APPENDECTOMY;  Surgeon: Rolm Bookbinder, MD;  Location: [place];  Service: General;  Laterality: N/A;  Opened @1855 .   FRACTURE SURGERY Right    Clavicle   JOINT REPLACEMENT N/A    [employer] 07/04/2019   KNEE ARTHROSCOPY W/ MENISCAL REPAIR Left    NOSE SURGERY     TONSILLECTOMY     TOTAL KNEE ARTHROPLASTY Left 07/16/2015   Procedure: TOTAL KNEE ARTHROPLASTY;  Surgeon: Dorna Leitz, MD;  Location: [place] [facility];  Service: Orthopedics;  Laterality: Left;   WISDOM TOOTH EXTRACTION      Prior to Admission medications   Medication Sig Start Date End Date Taking? Authorizing Provider  amLODipine (NORVASC) 10 MG tablet Take 1 tablet (10 mg total) by mouth daily. 09/07/19  Yes Lesleigh Noe, MD  aspirin EC 81 MG tablet Take 81 mg by mouth daily.   Yes [provider]  atorvastatin (LIPITOR) 20 MG tablet TAKE 1 TABLET BY MOUTH EVERY DAY 09/11/20  Yes Lesleigh Noe, MD  chlorthalidone (HYGROTON) 25 MG tablet TAKE 1 TABLET BY MOUTH EVERY DAY 09/19/20  Yes Lesleigh Noe, MD  cyclobenzaprine (FLEXERIL) 10 MG tablet Take 10 mg by mouth at bedtime as needed for muscle spasms.   Yes [provider]  doxazosin (CARDURA) 2 MG tablet Take 1 tablet (2 mg total) by mouth daily. 09/07/19  Yes Lesleigh Noe, MD  Multiple Vitamin (MULITIVITAMIN WITH MINERALS) TABS Take 1 tablet by mouth daily.   Yes [provider]  naproxen sodium (ALEVE) 220 MG tablet Take 220 mg by mouth 2 (two) times daily as needed (for pain).   Yes [provider]  OMEGA 3 1200 MG CAPS Take 1,200 mg by mouth  daily.    Yes [provider]  sildenafil (VIAGRA) 100 MG tablet Take 1 tablet (100 mg total) by mouth as needed for erectile dysfunction. 09/19/19  Yes Lesleigh Noe, MD  traMADol (ULTRAM) 50 MG tablet Take 1 tablet (50 mg total) by mouth 2 (two) times daily as needed for severe pain. Take 1 at dinner time and 1 at bedtime Patient taking differently: Take 50 mg by mouth at bedtime as needed (for pain). 03/19/20  Yes Lesleigh Noe, MD  UNABLE TO FIND CPAP machine- At bedtime   Yes [provider]    Allergies  Allergen Reactions   Turmeric Anaphylaxis and Swelling     Social History   Socioeconomic History   Marital status: Married  Spouse name: Tammy   Number of children: 3   Years of education: Some college   Highest education level: Not on file  Occupational History   Not on file  Tobacco Use   Smoking status: Former    Types: Cigars    Quit date: 04/30/2011    Years since quitting: 9.4   Smokeless tobacco: Never  Vaping Use   Vaping Use: Never used  Substance and Sexual Activity   Alcohol use: Yes   Drug use: No   Sexual activity: Yes    Birth control/protection: Post-menopausal  Other Topics Concern   Not on file  Social History Narrative   09/07/19   From: [place], moved here to be near parents   Living: with Lynelle Smoke, wife (1997) and daughter   Work: Clinical cytogeneticist      Family: 3 children - phillip, joshua, angela (still in college) - 1 grandchild      Enjoys: video games, ankle and knee keep him sitting      Exercise: not currently   Diet: needs to work on diet      Safety   Seat belts: Yes    Guns: yes and secure   Safe in relationships: Yes    Social Determinants of Radio broadcast assistant Strain: Not on file  Food Insecurity: Not on file  Transportation Needs: Not on file  Physical Activity: Not on file  Stress: Not on file  Social Connections: Not on file  Intimate Partner Violence: Not on file    Family History   Problem Relation Age of Onset   Heart attack Mother 44    Review of Systems  Constitutional:  Negative for chills and fever.  HENT:  Negative for congestion and sore throat.   Eyes:  Negative for blurred vision and double vision.  Respiratory:  Negative for shortness of breath.   Cardiovascular:  Negative for chest pain.  Gastrointestinal:  Negative for heartburn, nausea and vomiting.  Genitourinary: Negative.   Musculoskeletal: Negative.  Negative for myalgias.  Skin:  Negative for rash.  Neurological:  Negative for dizziness and headaches.  Endo/Heme/Allergies:  Does not bruise/bleed easily.  Psychiatric/Behavioral:  Negative for depression. The patient is not nervous/anxious.     Physical Exam BP 130/70   Pulse 74   Temp (!) 97 F (36.1 C) (Temporal)   Ht 5' 4.1" (1.628 m)   Wt 273 lb 6 oz (124 kg)   SpO2 97%   BMI 46.78 kg/m    BP Readings from Last 3 Encounters:  10/08/20 130/70  03/28/20 (!) 147/80  03/19/20 120/70      Physical Exam Constitutional:      General: He is not in acute distress.    Appearance: He is well-developed. He is obese. He is not diaphoretic.  HENT:     Head: Normocephalic and atraumatic.     Right Ear: Tympanic membrane and ear canal normal.     Left Ear: Tympanic membrane and ear canal normal.     Nose: Nose normal.     Mouth/Throat:     Pharynx: Uvula midline.  Eyes:     General: No scleral icterus.    Conjunctiva/sclera: Conjunctivae normal.     Pupils: Pupils are equal, round, and reactive to light.  Cardiovascular:     Rate and Rhythm: Normal rate and regular rhythm.     Heart sounds: Normal heart sounds. No murmur heard. Pulmonary:     Effort: Pulmonary effort is normal. No respiratory  distress.     Breath sounds: Normal breath sounds. No wheezing.  Abdominal:     General: Bowel sounds are normal. There is no distension.     Palpations: Abdomen is soft. There is no mass.     Tenderness: There is no abdominal tenderness.  There is no guarding.  Musculoskeletal:        General: Normal range of motion.     Cervical back: Normal range of motion and neck supple.  Lymphadenopathy:     Cervical: No cervical adenopathy.  Skin:    General: Skin is warm and dry.     Capillary Refill: Capillary refill takes less than 2 seconds.  Neurological:     Mental Status: He is alert and oriented to person, place, and time.       Results:  PHQ-9:  Depression screen [facility] 2/9 10/08/2020 09/07/2019 07/07/2019  Decreased Interest 0 0 0  Down, Depressed, Hopeless 0 0 0  PHQ - 2 Score 0 0 0       Assessment: 55 y.o. here for routine annual physical examination.  Plan: Problem List Items Addressed This Visit       Musculoskeletal and Integument   Primary osteoarthritis of left knee (Chronic)   Relevant Medications   traMADol (ULTRAM) 50 MG tablet   cyclobenzaprine (FLEXERIL) 10 MG tablet     Other   Hyperlipidemia   Relevant Medications   amLODipine (NORVASC) 10 MG tablet   atorvastatin (LIPITOR) 20 MG tablet   doxazosin (CARDURA) 2 MG tablet   Other Relevant Orders   Lipid panel   Chronic narcotic use   Relevant Medications   traMADol (ULTRAM) 50 MG tablet   Prediabetes   Relevant Orders   Hemoglobin A1c   Other Visit Diagnoses     Annual physical exam    -  Primary   Essential hypertension       Relevant Medications   amLODipine (NORVASC) 10 MG tablet   atorvastatin (LIPITOR) 20 MG tablet   doxazosin (CARDURA) 2 MG tablet   Other Relevant Orders   Comprehensive metabolic panel   CBC   Screening for colon cancer       Relevant Orders   Ambulatory referral to Gastroenterology   Need for hepatitis C screening test       Relevant Orders   Hepatitis C antibody   Encounter for screening for HIV       Relevant Orders   HIV Antibody (routine testing w rflx)       Screening: -- Blood pressure screen  controlled -- cholesterol screening: will obtain -- Weight screening: overweight: continue to  monitor -- Diabetes Screening: will obtain -- Nutrition: normal - Encouraged healthy diet  The ASCVD Risk score (Arnett DK, et al., 2019) failed to calculate for the following reasons:   The valid total cholesterol range is 130 to 320 mg/dL  -- ASA 81 mg discussed if CVD risk >10% age 28-59 and willing to take for 10 years -- Statin therapy for Age 49-75 with CVD risk >7.5%  Psych -- Depression screening (PHQ-9): negative  Safety -- tobacco screening: not using -- alcohol screening:  low-risk usage. -- no evidence of domestic violence or intimate partner violence.  Cancer Screening -- Prostate (age 31-69) declined -- Colon (age 90-75) ordered -- Lung not indicated   Immunizations There is no immunization history for the selected administration types on file for this patient.  -- flu vaccine not up to date - declined -- TDAP q10  years not up to date - pt will get at next visit - out of vaccines today -- Shingles (age >50) not up to date - pt will consider -- Covid-19 Vaccine not up to date - declined  Encouraged regular vision and dental screening. Encouraged healthy exercise and diet.   Lesleigh Noe

## 2020-10-09 LAB — HEPATITIS C ANTIBODY
Hepatitis C Ab: NONREACTIVE
SIGNAL TO CUT-OFF: 0.06 (ref <1.00)

## 2020-10-09 LAB — HIV ANTIBODY (ROUTINE TESTING W REFLEX): HIV 1&2 Ab, 4th Generation: NONREACTIVE

## 2020-12-26 ENCOUNTER — Other Ambulatory Visit: Payer: Self-pay | Admitting: Family Medicine

## 2021-01-29 ENCOUNTER — Other Ambulatory Visit: Payer: Self-pay | Admitting: Family Medicine

## 2021-01-29 ENCOUNTER — Other Ambulatory Visit: Payer: Self-pay

## 2021-01-29 ENCOUNTER — Ambulatory Visit: Payer: Self-pay

## 2021-01-29 DIAGNOSIS — M25512 Pain in left shoulder: Secondary | ICD-10-CM

## 2021-03-01 DIAGNOSIS — C499 Malignant neoplasm of connective and soft tissue, unspecified: Secondary | ICD-10-CM | POA: Insufficient documentation

## 2021-03-03 ENCOUNTER — Other Ambulatory Visit: Payer: Self-pay | Admitting: Family Medicine

## 2021-04-06 ENCOUNTER — Other Ambulatory Visit: Payer: Self-pay | Admitting: Family Medicine

## 2021-04-08 ENCOUNTER — Ambulatory Visit: Payer: Commercial Managed Care - PPO | Admitting: Family Medicine

## 2021-04-08 ENCOUNTER — Telehealth: Payer: Self-pay | Admitting: *Deleted

## 2021-04-08 VITALS — BP 130/80 | HR 75 | Temp 98.0°F | Ht 64.1 in | Wt 274.4 lb

## 2021-04-08 DIAGNOSIS — M1712 Unilateral primary osteoarthritis, left knee: Secondary | ICD-10-CM | POA: Diagnosis not present

## 2021-04-08 DIAGNOSIS — F1190 Opioid use, unspecified, uncomplicated: Secondary | ICD-10-CM | POA: Diagnosis not present

## 2021-04-08 DIAGNOSIS — C499 Malignant neoplasm of connective and soft tissue, unspecified: Secondary | ICD-10-CM

## 2021-04-08 DIAGNOSIS — F418 Other specified anxiety disorders: Secondary | ICD-10-CM

## 2021-04-08 MED ORDER — TRAMADOL HCL 50 MG PO TABS: 50.0000 mg | ORAL_TABLET | Freq: Two times a day (BID) | ORAL | 5 refills | Status: DC | PRN

## 2021-04-08 MED ORDER — TRAMADOL HCL 50 MG PO TABS: 50.0000 mg | ORAL_TABLET | Freq: Every evening | ORAL | 5 refills | Status: DC | PRN

## 2021-04-08 MED ORDER — DIAZEPAM 5 MG PO TABS: 5.0000 mg | ORAL_TABLET | Freq: Every day | ORAL | 1 refills | Status: DC | PRN

## 2021-04-08 MED ORDER — CYCLOBENZAPRINE HCL 10 MG PO TABS: 10.0000 mg | ORAL_TABLET | Freq: Three times a day (TID) | ORAL | 1 refills | Status: DC | PRN

## 2021-04-08 NOTE — Assessment & Plan Note (Signed)
S/p surgery. Following with Rad onc and medical oncology. Recent PET scan. Anticipating radiation +/- chemo. Appreciate oncology support. Increased pain anticipated.  ?

## 2021-04-08 NOTE — Assessment & Plan Note (Signed)
Still undergoing work-up and plan for new cancer. Using diazepam when he wakes up at night and cannot go back to sleep. Discussed not taking with cyclobenzaprine and tramadol. Advised against daily use to due dependency. Refill provided. Return if needing daily to discuss alternatives.  ?

## 2021-04-08 NOTE — Telephone Encounter (Signed)
Pt saw PCP today and Tramadol was increased from one tab daily to 2 tabs daily. Dr. Einar Pheasant sent in #60 tabs but didn't change the directions so pharmacy is asking PCP to resend in the Tramadol with the new directions of 2 tabs daily before they give pt Rx ? ?[facility] [address] ?

## 2021-04-08 NOTE — Telephone Encounter (Signed)
Updated.

## 2021-04-08 NOTE — Progress Notes (Signed)
? ?Subjective:  ? ?  ?Scott York is a 56 y.o. male presenting for Medication Refill ?  ? ? ?HPI ? 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 ?Has been getting diazepam 5 mg to help with sleep and anxiety - not   ? ?#Insomnia ?- difficulty staying asleep ?- when he gets up to use the bathroom once overnight ?- then trouble falling back asleep ?- using CPAP which helps him stay asleep ?- the surgery team gave him valium for sleep ? ?Review of Systems ? ? ?Social History  ? ?Tobacco Use  ?Smoking Status Former  ? Types: Cigars  ? Quit date: 04/30/2011  ? Years since quitting: 9.9  ?Smokeless Tobacco Never  ? ? ? ?   ?Objective:  ?  ?BP Readings from Last 3 Encounters:  ?04/08/21 130/80  ?10/08/20 130/70  ?03/28/20 (!) 147/80  ? ?Wt Readings from Last 3 Encounters:  ?04/08/21 274 lb 6 oz (124.5 kg)  ?10/08/20 273 lb 6 oz (124 kg)  ?03/19/20 286 lb (129.7 kg)  ? ? ?BP 130/80   Pulse 75   Temp 98 ?F (36.7 ?C) (Oral)   Ht 5' 4.1" (1.628 m)   Wt 274 lb 6 oz (124.5 kg)   SpO2 97%   BMI 46.95 kg/m?  ? ? ?Physical Exam ?Constitutional:   ?   Appearance: Normal appearance. He is not ill-appearing or diaphoretic.  ?HENT:  ?   Right Ear: External ear normal.  ?   Left Ear: External ear normal.  ?   Nose: Nose normal.  ?Eyes:  ?   General: No scleral icterus. ?   Extraocular Movements: Extraocular movements intact.  ?   Conjunctiva/sclera: Conjunctivae normal.  ?Cardiovascular:  ?   Rate and Rhythm: Normal rate and regular rhythm.  ?   Heart sounds: No murmur  heard. ?Pulmonary:  ?   Effort: Pulmonary effort is normal. No respiratory distress.  ?   Breath sounds: Normal breath sounds. No wheezing.  ?Musculoskeletal:  ?   Cervical back: Neck supple.  ?Skin: ?   General: Skin is warm and dry.  ?Neurological:  ?   Mental Status: He is alert. Mental status is at baseline.  ?Psychiatric:     ?   Mood and Affect: Mood normal.  ? ? ? ? ? ?   ?Assessment & Plan:  ? ?Problem List Items Addressed This Visit   ? ?  ? Musculoskeletal and Integument  ? Primary osteoarthritis of left knee (Chronic)  ? Relevant Medications  ? traMADol (ULTRAM) 50 MG tablet  ? cyclobenzaprine (FLEXERIL) 10 MG tablet  ?  ? Other  ? Chronic narcotic use  ?  Recent surgery and cancer diagnosis. Reasonable to allow increase in tramadol. No longer receiving oxycodone/hydrocodone though issues tolerating. Increase tramadol to up to #2 50 mg tablet daily prn ?  ?  ? Relevant Medications  ? traMADol (ULTRAM) 50 MG tablet  ? Myxoid liposarcoma (HCC)  ?  S/p surgery. Following with Rad onc  and medical oncology. Recent PET scan. Anticipating radiation +/- chemo. Appreciate oncology support. Increased pain anticipated.  ?  ?  ? Relevant Medications  ? ondansetron (ZOFRAN-ODT) 4 MG disintegrating tablet  ? diazepam (VALIUM) 5 MG tablet  ? Anxiety about health - Primary  ?  Still undergoing work-up and plan for new cancer. Using diazepam when he wakes up at night and cannot go back to sleep. Discussed not taking with cyclobenzaprine and tramadol. Advised against daily use to due dependency. Refill provided. Return if needing daily to discuss alternatives.  ?  ?  ? Relevant Medications  ? diazepam (VALIUM) 5 MG tablet  ? ? ? ?Return in about 2 months (around 06/08/2021) for anxiety/sleep if needed. ? ?Lesleigh Noe, MD ? ?This visit occurred during the SARS-CoV-2 public health emergency.  Safety protocols were in place, including screening questions prior to the visit, additional usage of staff PPE, and extensive  cleaning of exam room while observing appropriate contact time as indicated for disinfecting solutions.  ? ?

## 2021-04-08 NOTE — Patient Instructions (Signed)
New plan ? ?- Tramadol - OK to take up to 2 per day ?- Diazepam - Take only as needed for anxiety/sleep - try to limit to only as needed ?- Cyclobenzaprine - once daily as needed while taking Diazepam ?

## 2021-04-08 NOTE — Assessment & Plan Note (Signed)
Recent surgery and cancer diagnosis. Reasonable to allow increase in tramadol. No longer receiving oxycodone/hydrocodone though issues tolerating. Increase tramadol to up to #2 50 mg tablet daily prn ?

## 2021-05-10 ENCOUNTER — Other Ambulatory Visit: Payer: Self-pay | Admitting: Family Medicine

## 2021-06-05 ENCOUNTER — Other Ambulatory Visit: Payer: Self-pay | Admitting: Family Medicine

## 2021-06-07 ENCOUNTER — Other Ambulatory Visit: Payer: Self-pay | Admitting: Family Medicine

## 2021-07-23 ENCOUNTER — Other Ambulatory Visit: Payer: Self-pay | Admitting: Family Medicine

## 2021-08-18 ENCOUNTER — Other Ambulatory Visit: Payer: Self-pay | Admitting: Family Medicine

## 2021-08-20 NOTE — Telephone Encounter (Signed)
My chart message sent to pt.

## 2021-08-26 NOTE — Telephone Encounter (Signed)
Patient called in returning a call he received. I did ask him about the medication and he stated he is taking it two times a day. Once when he get home from work around 5-5:30 pm and once at night before he goes to bed around 10:30-11 pm. He stated that he had stopped taking while taking Diazepam. He did have question about the instructions because its stating he takes them as needed, but suppose to state one after work and one before bed.

## 2021-08-26 NOTE — Telephone Encounter (Signed)
Refill sent to reflect current dosage

## 2021-08-26 NOTE — Telephone Encounter (Signed)
Left message to return call to our office.  To see if he is taking still and how often.

## 2021-10-01 ENCOUNTER — Ambulatory Visit (INDEPENDENT_AMBULATORY_CARE_PROVIDER_SITE_OTHER): Payer: Commercial Managed Care - PPO | Admitting: Family Medicine

## 2021-10-01 VITALS — BP 120/80 | HR 73 | Temp 97.9°F | Wt 267.0 lb

## 2021-10-01 DIAGNOSIS — Z1211 Encounter for screening for malignant neoplasm of colon: Secondary | ICD-10-CM

## 2021-10-01 DIAGNOSIS — C499 Malignant neoplasm of connective and soft tissue, unspecified: Secondary | ICD-10-CM | POA: Diagnosis not present

## 2021-10-01 DIAGNOSIS — Z23 Encounter for immunization: Secondary | ICD-10-CM | POA: Diagnosis not present

## 2021-10-01 DIAGNOSIS — F418 Other specified anxiety disorders: Secondary | ICD-10-CM

## 2021-10-01 DIAGNOSIS — I10 Essential (primary) hypertension: Secondary | ICD-10-CM | POA: Diagnosis not present

## 2021-10-01 DIAGNOSIS — R7303 Prediabetes: Secondary | ICD-10-CM

## 2021-10-01 DIAGNOSIS — N529 Male erectile dysfunction, unspecified: Secondary | ICD-10-CM | POA: Diagnosis not present

## 2021-10-01 DIAGNOSIS — E782 Mixed hyperlipidemia: Secondary | ICD-10-CM

## 2021-10-01 DIAGNOSIS — R4589 Other symptoms and signs involving emotional state: Secondary | ICD-10-CM

## 2021-10-01 DIAGNOSIS — F1190 Opioid use, unspecified, uncomplicated: Secondary | ICD-10-CM

## 2021-10-01 DIAGNOSIS — M1712 Unilateral primary osteoarthritis, left knee: Secondary | ICD-10-CM

## 2021-10-01 DIAGNOSIS — R490 Dysphonia: Secondary | ICD-10-CM

## 2021-10-01 LAB — LIPID PANEL
Cholesterol: 117 mg/dL (ref 0–200)
HDL: 52.7 mg/dL (ref >39.00)
LDL Cholesterol: 48 mg/dL (ref 0–99)
NonHDL: 64.06
Total CHOL/HDL Ratio: 2
Triglycerides: 81 mg/dL (ref 0.0–149.0)
VLDL: 16.2 mg/dL (ref 0.0–40.0)

## 2021-10-01 LAB — HEMOGLOBIN A1C: Hgb A1c MFr Bld: 5.7 % (ref 4.6–6.5)

## 2021-10-01 MED ORDER — AMLODIPINE BESYLATE 10 MG PO TABS
10.0000 mg | ORAL_TABLET | Freq: Every day | ORAL | 3 refills | Status: DC
Start: 2021-10-01 — End: 2021-12-13

## 2021-10-01 MED ORDER — CHLORTHALIDONE 25 MG PO TABS: 25.0000 mg | ORAL_TABLET | Freq: Every day | ORAL | 3 refills | Status: DC

## 2021-10-01 MED ORDER — SILDENAFIL CITRATE 100 MG PO TABS: 100.0000 mg | ORAL_TABLET | ORAL | 0 refills | Status: DC | PRN

## 2021-10-01 MED ORDER — TRAMADOL HCL 50 MG PO TABS: 50.0000 mg | ORAL_TABLET | Freq: Two times a day (BID) | ORAL | 3 refills | Status: DC | PRN

## 2021-10-01 MED ORDER — DOXAZOSIN MESYLATE 2 MG PO TABS: 2.0000 mg | ORAL_TABLET | Freq: Every day | ORAL | 3 refills | Status: DC

## 2021-10-01 MED ORDER — ATORVASTATIN CALCIUM 20 MG PO TABS: 20.0000 mg | ORAL_TABLET | Freq: Every day | ORAL | 3 refills | Status: DC

## 2021-10-01 NOTE — Assessment & Plan Note (Signed)
Etiology not clear.  No other associated symptoms.  May be secondary to CPAP use, he will follow-up with his provider.  Advised trial of heartburn medicine he will start omeprazole daily.  If no improvement he will try allergy medicine for 2 weeks.  Referral to ENT given tobacco history if he does not respond to either of these 3 treatments.

## 2021-10-01 NOTE — Progress Notes (Signed)
Subjective:     Scott York is a 56 y.o. 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 Increase tramadol due to surgery/cancer #2 50 mg per day  Social History   Tobacco Use  Smoking Status Former   Types: Cigars   Quit date: 04/30/2011   Years since quitting: 10.4  Smokeless Tobacco Never        Objective:    BP Readings from Last 3 Encounters:  10/01/21 120/80  04/08/21 130/80  10/08/20 130/70   Wt Readings from Last 3 Encounters:  10/01/21 267 lb (121.1 kg)  04/08/21 274 lb 6 oz (124.5 kg)  10/08/20 273 lb 6 oz (124 kg)    BP 120/80   Pulse 73   Temp 97.9 F (36.6 C) (Temporal)   Wt 267 lb (121.1 kg)   SpO2 97%   BMI 45.69 kg/m    Physical Exam Constitutional:      Appearance: Normal appearance. He is not ill-appearing or diaphoretic.  HENT:     Head: Normocephalic and atraumatic.     Right Ear: External ear normal.     Left Ear: External ear normal.     Nose: Nose normal.     Mouth/Throat:     Mouth: Mucous membranes are moist.     Pharynx: No oropharyngeal exudate or posterior oropharyngeal erythema.  Eyes:     General: No scleral icterus.    Extraocular Movements: Extraocular movements intact.     Conjunctiva/sclera: Conjunctivae normal.  Cardiovascular:     Rate and Rhythm: Normal rate and regular rhythm.     Heart sounds: No murmur heard. Pulmonary:     Effort: Pulmonary effort is normal. No respiratory distress.     Breath sounds: Normal  breath sounds. No wheezing.  Abdominal:     General: Abdomen is flat. Bowel sounds are normal. There is no distension.     Palpations: Abdomen is soft.     Tenderness: There is no abdominal tenderness. There is no guarding or rebound.  Musculoskeletal:     Cervical back: Neck supple.  Skin:    General: Skin is warm and dry.  Neurological:     Mental Status: He is alert. Mental status is at baseline.  Psychiatric:        Mood and Affect: Mood normal.       Lab Results  Component Value Date   CHOL 113 10/08/2020   HDL 42.50 10/08/2020   LDLCALC 51 10/08/2020   TRIG 97.0 10/08/2020   CHOLHDL 3 10/08/2020       Assessment & Plan:   Problem List Items Addressed This Visit       Cardiovascular and Mediastinum   Hypertension    Controlled.  Continue amlodipine 10 mg, chlorthalidone 25 mg, doxazosin 2 mg.      Relevant Medications   amLODipine (NORVASC) 10 MG tablet  atorvastatin (LIPITOR) 20 MG tablet   chlorthalidone (HYGROTON) 25 MG tablet   doxazosin (CARDURA) 2 MG tablet   sildenafil (VIAGRA) 100 MG tablet     Musculoskeletal and Integument   Primary osteoarthritis of left knee - Primary (Chronic)    He was previously on tramadol 50 mg once daily, he was increased to twice daily in the setting of his cancer treatment.  He feels his pain is significantly better controlled on tramadol 50 mg twice daily, refill provided.      Relevant Medications   traMADol (ULTRAM) 50 MG tablet     Other   Hyperlipidemia    Controlled. Recheck labs today. Cont atorvastatin 20 mg      Relevant Medications   amLODipine (NORVASC) 10 MG tablet   atorvastatin (LIPITOR) 20 MG tablet   chlorthalidone (HYGROTON) 25 MG tablet   doxazosin (CARDURA) 2 MG tablet   sildenafil (VIAGRA) 100 MG tablet   Other Relevant Orders   Lipid panel   Erectile dysfunction    Controlled on viagra 100 mg      Relevant Medications   sildenafil (VIAGRA) 100 MG tablet   Chronic narcotic use    Relevant Medications   traMADol (ULTRAM) 50 MG tablet   Prediabetes    He has lost weight, continue healthy diet.      Relevant Orders   Hemoglobin A1c   Myxoid liposarcoma [facility])    S/p radiation and surgery - following with Oncology for ongoing monitoring.       Anxiety about health    Improved. No longer needing medication       Hoarseness of voice    Etiology not clear.  No other associated symptoms.  May be secondary to CPAP use, he will follow-up with his provider.  Advised trial of heartburn medicine he will start omeprazole daily.  If no improvement he will try allergy medicine for 2 weeks.  Referral to ENT given tobacco history if he does not respond to either of these 3 treatments.      Relevant Orders   Ambulatory referral to ENT   Other Visit Diagnoses     Essential hypertension       Relevant Medications   amLODipine (NORVASC) 10 MG tablet   atorvastatin (LIPITOR) 20 MG tablet   chlorthalidone (HYGROTON) 25 MG tablet   doxazosin (CARDURA) 2 MG tablet   sildenafil (VIAGRA) 100 MG tablet   Screening for colon cancer       Relevant Orders   Ambulatory referral to Gastroenterology   Need for Tdap vaccination       Relevant Orders   Tdap vaccine greater than or equal to 7yo IM (Completed)        Return in about 3 months (around 12/31/2021) for TOC with [employer].  Lesleigh Noe, MD

## 2021-10-01 NOTE — Assessment & Plan Note (Signed)
He was previously on tramadol 50 mg once daily, he was increased to twice daily in the setting of his cancer treatment.  He feels his pain is significantly better controlled on tramadol 50 mg twice daily, refill provided.

## 2021-10-01 NOTE — Assessment & Plan Note (Signed)
Controlled. Recheck labs today. Cont atorvastatin 20 mg

## 2021-10-01 NOTE — Assessment & Plan Note (Signed)
He has lost weight, continue healthy diet.

## 2021-10-01 NOTE — Patient Instructions (Signed)
Hoarseness 1) Omeprazole 20 mg daily - if no change in 2 weeks - stop - if working continue for 8 weeks  2) Start Zyrtec daily  - if no change 2 weeks stop  #Referral I have placed a referral to a specialist for you. You should receive a phone call from the specialty office. Make sure your voicemail is not full and that if you are able to answer your phone to unknown or new numbers.   It may take up to 2 weeks to hear about the referral. If you do not hear anything in 2 weeks, please call our office and ask to speak with the referral coordinator.  = Ear, nose and throat if no improvement

## 2021-10-01 NOTE — Assessment & Plan Note (Signed)
Improved. No longer needing medication

## 2021-10-01 NOTE — Assessment & Plan Note (Signed)
Controlled on viagra 100 mg

## 2021-10-01 NOTE — Assessment & Plan Note (Signed)
Controlled.  Continue amlodipine 10 mg, chlorthalidone 25 mg, doxazosin 2 mg.

## 2021-10-01 NOTE — Assessment & Plan Note (Signed)
S/p radiation and surgery - following with Oncology for ongoing monitoring.

## 2021-11-14 ENCOUNTER — Other Ambulatory Visit: Payer: Self-pay

## 2021-11-14 MED ORDER — CYCLOBENZAPRINE HCL 10 MG PO TABS: 10.0000 mg | ORAL_TABLET | Freq: Two times a day (BID) | ORAL | 0 refills | Status: DC

## 2021-11-14 NOTE — Telephone Encounter (Signed)
[facility] [address] faxed request for cyclobenzaprine 10 mg. Last refilled # [phone] on 08/26/2021 Last seen by Dr Einar Pheasant for FU on 10/01/21 but did not see mention in note about cyclobenzaprine. No TOC appt noted Sending note to Dutch Quint FNP.

## 2021-11-19 ENCOUNTER — Ambulatory Visit (AMBULATORY_SURGERY_CENTER): Payer: Self-pay

## 2021-11-19 VITALS — Ht 65.0 in | Wt 268.0 lb

## 2021-11-19 DIAGNOSIS — Z1211 Encounter for screening for malignant neoplasm of colon: Secondary | ICD-10-CM

## 2021-11-19 MED ORDER — NA SULFATE-K SULFATE-MG SULF 17.5-3.13-1.6 GM/177ML PO SOLN: 1.0000 | Freq: Once | ORAL | 0 refills | Status: AC

## 2021-11-19 NOTE — Progress Notes (Signed)

## 2021-12-09 ENCOUNTER — Ambulatory Visit (AMBULATORY_SURGERY_CENTER): Payer: Commercial Managed Care - PPO | Admitting: Gastroenterology

## 2021-12-09 ENCOUNTER — Encounter: Payer: Self-pay | Admitting: Gastroenterology

## 2021-12-09 VITALS — BP 114/79 | HR 66 | Temp 98.6°F | Resp 28 | Ht 64.0 in | Wt 268.0 lb

## 2021-12-09 DIAGNOSIS — Z1211 Encounter for screening for malignant neoplasm of colon: Secondary | ICD-10-CM

## 2021-12-09 DIAGNOSIS — D122 Benign neoplasm of ascending colon: Secondary | ICD-10-CM

## 2021-12-09 DIAGNOSIS — D125 Benign neoplasm of sigmoid colon: Secondary | ICD-10-CM | POA: Diagnosis not present

## 2021-12-09 DIAGNOSIS — K640 First degree hemorrhoids: Secondary | ICD-10-CM

## 2021-12-09 DIAGNOSIS — K5730 Diverticulosis of large intestine without perforation or abscess without bleeding: Secondary | ICD-10-CM

## 2021-12-09 MED ORDER — SODIUM CHLORIDE 0.9 % IV SOLN: 500.0000 mL | INTRAVENOUS | Status: DC

## 2021-12-09 NOTE — Progress Notes (Signed)
Report to PACU, RN, vss, BBS= Clear.  

## 2021-12-09 NOTE — Patient Instructions (Signed)
Discharge instructions given. Handouts on polyps,diverticulosis and hemorrhoids. Resume previous medications. YOU HAD AN ENDOSCOPIC PROCEDURE TODAY AT THE [facility]:   Refer to the procedure report that was given to you for any specific questions about what was found during the examination.  If the procedure report does not answer your questions, please call your gastroenterologist to clarify.  If you requested that your care partner not be given the details of your procedure findings, then the procedure report has been included in a sealed envelope for you to review at your convenience later.  YOU SHOULD EXPECT: Some feelings of bloating in the abdomen. Passage of more gas than usual.  Walking can help get rid of the air that was put into your GI tract during the procedure and reduce the bloating. If you had a lower endoscopy (such as a colonoscopy or flexible sigmoidoscopy) you may notice spotting of blood in your stool or on the toilet paper. If you underwent a bowel prep for your procedure, you may not have a normal bowel movement for a few days.  Please Note:  You might notice some irritation and congestion in your nose or some drainage.  This is from the oxygen used during your procedure.  There is no need for concern and it should clear up in a day or so.  SYMPTOMS TO REPORT IMMEDIATELY:  Following lower endoscopy (colonoscopy or flexible sigmoidoscopy):  Excessive amounts of blood in the stool  Significant tenderness or worsening of abdominal pains  Swelling of the abdomen that is new, acute  Fever of 100F or higher   For urgent or emergent issues, a gastroenterologist can be reached at any hour by calling [phone]. Do not use MyChart messaging for urgent concerns.    DIET:  We do recommend a small meal at first, but then you may proceed to your regular diet.  Drink plenty of fluids but you should avoid alcoholic beverages for 24 hours.  ACTIVITY:  You should  plan to take it easy for the rest of today and you should NOT DRIVE or use heavy machinery until tomorrow (because of the sedation medicines used during the test).    FOLLOW UP: Our staff will call the number listed on your records the next business day following your procedure.  We will call around 7:15- 8:00 am to check on you and address any questions or concerns that you may have regarding the information given to you following your procedure. If we do not reach you, we will leave a message.     If any biopsies were taken you will be contacted by phone or by letter within the next 1-3 weeks.  Please call us at [phone] if you have not heard about the biopsies in 3 weeks.    SIGNATURES/CONFIDENTIALITY: You and/or your care partner have signed paperwork which will be entered into your electronic medical record.  These signatures attest to the fact that that the information above on your After Visit Summary has been reviewed and is understood.  Full responsibility of the confidentiality of this discharge information lies with you and/or your care-partner. 

## 2021-12-09 NOTE — Progress Notes (Signed)
Called to room to assist during endoscopic procedure.  Patient ID and intended procedure confirmed with present staff. Received instructions for my participation in the procedure from the performing physician.  

## 2021-12-09 NOTE — Op Note (Signed)
[facility] Patient Name: Scott York Procedure Date: 12/09/2021 8:27 AM MRN: [id number] Endoscopist: Gerrit Heck , MD, [id number] Age: 56 Referring MD:  Date of Birth: [birth year] Gender: Male Account #: [id number] Procedure:                Colonoscopy Indications:              Screening for colorectal malignant neoplasm, This                            is the patient's first colonoscopy Medicines:                Monitored Anesthesia Care Procedure:                Pre-Anesthesia Assessment:                           - Prior to the procedure, a History and Physical                            was performed, and patient medications and                            allergies were reviewed. The patient's tolerance of                            previous anesthesia was also reviewed. The risks                            and benefits of the procedure and the sedation                            options and risks were discussed with the patient.                            All questions were answered, and informed consent                            was obtained. Prior Anticoagulants: The patient has                            taken no anticoagulant or antiplatelet agents. ASA                            Grade Assessment: III - A patient with severe                            systemic disease. After reviewing the risks and                            benefits, the patient was deemed in satisfactory                            condition to undergo the procedure.  After obtaining informed consent, the colonoscope                            was passed under direct vision. Throughout the                            procedure, the patient's blood pressure, pulse, and                            oxygen saturations were monitored continuously. The                            Olympus CF-HQ190L [id number]) Colonoscope was                            introduced through the  anus and advanced to the the                            cecum, identified by appendiceal orifice and                            ileocecal valve. The colonoscopy was performed                            without difficulty. The patient tolerated the                            procedure well. The quality of the bowel                            preparation was good. The ileocecal valve,                            appendiceal orifice, and rectum were photographed. Scope In: 8:55:37 AM Scope Out: 9:15:13 AM Scope Withdrawal Time: 0 hours 11 minutes 33 seconds  Total Procedure Duration: 0 hours 19 minutes 36 seconds  Findings:                 The perianal and digital rectal examinations were                            normal.                           Two sessile polyps were found in the sigmoid colon                            and ascending colon. The polyps were 2 to 4 mm in                            size. These polyps were removed with a cold snare.                            Resection and retrieval were complete. Estimated  blood loss was minimal.                           Multiple large-mouthed and small-mouthed                            diverticula were found in the sigmoid colon.                           Non-bleeding internal hemorrhoids were found during                            retroflexion. The hemorrhoids were small. Complications:            No immediate complications. Estimated Blood Loss:     Estimated blood loss was minimal. Impression:               - Two 2 to 4 mm polyps in the sigmoid colon and in                            the ascending colon, removed with a cold snare.                            Resected and retrieved.                           - Diverticulosis in the sigmoid colon.                           - Non-bleeding internal hemorrhoids. Recommendation:           - Patient has a contact number available for                             emergencies. The signs and symptoms of potential                            delayed complications were discussed with the                            patient. Return to normal activities tomorrow.                            Written discharge instructions were provided to the                            patient.                           - Resume previous diet.                           - Continue present medications.                           - Await pathology results.                           -  Repeat colonoscopy for surveillance based on                            pathology results.                           - Return to GI clinic PRN. Gerrit Heck, MD 12/09/2021 9:19:15 AM

## 2021-12-09 NOTE — Progress Notes (Signed)
GASTROENTEROLOGY PROCEDURE H&P NOTE   Primary Care Physician: Waunita Schooner, MD    Reason for Procedure:  Colon Cancer screening  Plan:    Colonoscopy  Patient is appropriate for endoscopic procedure(s) in the ambulatory ([facility]) setting.  The nature of the procedure, as well as the risks, benefits, and alternatives were carefully and thoroughly reviewed with the patient. Ample time for discussion and questions allowed. The patient understood, was satisfied, and agreed to proceed.     HPI: Scott York is a 56 y.o. male who presents for colonoscopy for routine Colon Cancer screening.  No active GI symptoms.  No known family history of colon cancer or related malignancy.  Patient is otherwise without complaints or active issues today.  Past Medical History:  Diagnosis Date   Cancer ([facility])    Cholelithiasis    DDD (degenerative disc disease), lumbar    L5-S1   Hyperlipidemia    Hypertension    Obesity    Sleep apnea    UTI (lower urinary tract infection)     Past Surgical History:  Procedure Laterality Date   APPENDECTOMY  04/20/2011   Procedure: APPENDECTOMY;  Surgeon: Rolm Bookbinder, MD;  Location: [facility];  Service: General;  Laterality: N/A;  Opened '@1855'$ .   FRACTURE SURGERY Right    Clavicle   JOINT REPLACEMENT N/A    [employer] 07/04/2019   KNEE ARTHROSCOPY W/ MENISCAL REPAIR Left    NOSE SURGERY     TONSILLECTOMY     TOTAL KNEE ARTHROPLASTY Left 07/16/2015   Procedure: TOTAL KNEE ARTHROPLASTY;  Surgeon: Dorna Leitz, MD;  Location: [facility];  Service: Orthopedics;  Laterality: Left;   WISDOM TOOTH EXTRACTION      Prior to Admission medications   Medication Sig Start Date End Date Taking? Authorizing Provider  amLODipine (NORVASC) 10 MG tablet Take 1 tablet (10 mg total) by mouth daily. 10/01/21  Yes Waunita Schooner, MD  aspirin EC 81 MG tablet Take 81 mg by mouth daily.   Yes [provider]  atorvastatin (LIPITOR) 20 MG tablet Take 1 tablet (20 mg  total) by mouth daily. 10/01/21  Yes Waunita Schooner, MD  chlorthalidone (HYGROTON) 25 MG tablet Take 1 tablet (25 mg total) by mouth daily. 10/01/21  Yes Waunita Schooner, MD  doxazosin (CARDURA) 2 MG tablet Take 1 tablet (2 mg total) by mouth daily. 10/01/21  Yes Waunita Schooner, MD  Multiple Vitamin (MULITIVITAMIN WITH MINERALS) TABS Take 1 tablet by mouth daily.   Yes [provider]  naproxen sodium (ALEVE) 220 MG tablet Take 220 mg by mouth 2 (two) times daily as needed (for pain).   Yes [provider]  OMEGA 3 1200 MG CAPS Take 1,200 mg by mouth daily.    Yes [provider]  UNABLE TO FIND CPAP machine- At bedtime   Yes [provider]  cyclobenzaprine (FLEXERIL) 10 MG tablet Take 1 tablet (10 mg total) by mouth 2 (two) times daily. 11/14/21   Kennyth Arnold, FNP  sildenafil (VIAGRA) 100 MG tablet Take 1 tablet (100 mg total) by mouth as needed for erectile dysfunction. Patient not taking: Reported on 11/19/2021 10/01/21   Waunita Schooner, MD  silver sulfADIAZINE (SILVADENE) 1 % cream Apply to underarm area 3-4 times a day Patient not taking: Reported on 11/19/2021 05/24/21   [provider]  traMADol (ULTRAM) 50 MG tablet Take 1 tablet (50 mg total) by mouth 2 (two) times daily as needed (for pain). 10/01/21   Waunita Schooner, MD  Current Outpatient Medications  Medication Sig Dispense Refill   amLODipine (NORVASC) 10 MG tablet Take 1 tablet (10 mg total) by mouth daily. 90 tablet 3   aspirin EC 81 MG tablet Take 81 mg by mouth daily.     atorvastatin (LIPITOR) 20 MG tablet Take 1 tablet (20 mg total) by mouth daily. 90 tablet 3   chlorthalidone (HYGROTON) 25 MG tablet Take 1 tablet (25 mg total) by mouth daily. 90 tablet 3   doxazosin (CARDURA) 2 MG tablet Take 1 tablet (2 mg total) by mouth daily. 90 tablet 3   Multiple Vitamin (MULITIVITAMIN WITH MINERALS) TABS Take 1 tablet by mouth daily.     naproxen sodium (ALEVE) 220 MG tablet Take 220 mg by  mouth 2 (two) times daily as needed (for pain).     OMEGA 3 1200 MG CAPS Take 1,200 mg by mouth daily.      UNABLE TO FIND CPAP machine- At bedtime     cyclobenzaprine (FLEXERIL) 10 MG tablet Take 1 tablet (10 mg total) by mouth 2 (two) times daily. 180 tablet 0   sildenafil (VIAGRA) 100 MG tablet Take 1 tablet (100 mg total) by mouth as needed for erectile dysfunction. (Patient not taking: Reported on 11/19/2021) 30 tablet 0   silver sulfADIAZINE (SILVADENE) 1 % cream Apply to underarm area 3-4 times a day (Patient not taking: Reported on 11/19/2021)     traMADol (ULTRAM) 50 MG tablet Take 1 tablet (50 mg total) by mouth 2 (two) times daily as needed (for pain). 60 tablet 3   Current Facility-Administered Medications  Medication Dose Route Frequency Provider Last Rate Last Admin   0.9 %  sodium chloride infusion  500 mL Intravenous Continuous Meilyn Heindl V, DO        Allergies as of 12/09/2021 - Review Complete 12/09/2021  Allergen Reaction Noted   Turmeric Anaphylaxis and Swelling 03/19/2020    Family History  Problem Relation Age of Onset   Heart attack Mother 46   Colon cancer Neg Hx    Colon polyps Neg Hx    Esophageal cancer Neg Hx    Rectal cancer Neg Hx    Stomach cancer Neg Hx     Social History   Socioeconomic History   Marital status: Married    Spouse name: Tammy   Number of children: 3   Years of education: Some college   Highest education level: Not on file  Occupational History   Not on file  Tobacco Use   Smoking status: Former    Types: Cigars    Quit date: 04/30/2011    Years since quitting: 10.6   Smokeless tobacco: Never  Vaping Use   Vaping Use: Never used  Substance and Sexual Activity   Alcohol use: Yes   Drug use: No   Sexual activity: Yes  Other Topics Concern   Not on file  Social History Narrative   09/07/19   From: [place], moved here to be near parents   Living: with Lynelle Smoke, wife (1997) and daughter   Work: Clinical cytogeneticist       Family: 3 children - phillip, joshua, angela (still in college) - 1 grandchild      Enjoys: video games, ankle and knee keep him sitting      Exercise: not currently   Diet: needs to work on diet      Safety   Seat belts: Yes    Guns: yes and secure   Safe in relationships: Yes  Social Determinants of Health   Financial Resource Strain: Not on file  Food Insecurity: Not on file  Transportation Needs: Not on file  Physical Activity: Not on file  Stress: Not on file  Social Connections: Not on file  Intimate Partner Violence: Not on file    Physical Exam: Vital signs in last 24 hours: '@BP'$  (!) 154/68   Pulse 79   Temp 98.6 F (37 C)   Ht '5\' 4"'$  (1.626 m)   Wt 268 lb (121.6 kg)   SpO2 96%   BMI 46.00 kg/m  GEN: NAD EYE: Sclerae anicteric ENT: MMM CV: Non-tachycardic Pulm: CTA b/l GI: Soft, NT/ND NEURO:  Alert & Oriented x 3   Gerrit Heck, DO [facility]   12/09/2021 8:24 AM

## 2021-12-10 ENCOUNTER — Other Ambulatory Visit: Payer: Self-pay | Admitting: Family

## 2021-12-10 ENCOUNTER — Telehealth: Payer: Self-pay | Admitting: *Deleted

## 2021-12-10 NOTE — Telephone Encounter (Signed)
  Follow up Call-     12/09/2021    8:08 AM  Call back number  Post procedure Call Back phone  # [phone]  Permission to leave phone message Yes    Post procedure follow up phone call. No answer at number given.  Left message on voicemail.

## 2021-12-11 ENCOUNTER — Telehealth: Payer: Self-pay

## 2021-12-11 NOTE — Telephone Encounter (Signed)
Please call patient to set up [facility] appointment

## 2021-12-11 NOTE — Telephone Encounter (Signed)
Patient has been scheduled

## 2021-12-13 ENCOUNTER — Other Ambulatory Visit: Payer: Self-pay

## 2021-12-13 DIAGNOSIS — I10 Essential (primary) hypertension: Secondary | ICD-10-CM

## 2021-12-13 MED ORDER — AMLODIPINE BESYLATE 10 MG PO TABS: 10.0000 mg | ORAL_TABLET | Freq: Every day | ORAL | 0 refills | Status: DC

## 2021-12-16 ENCOUNTER — Encounter: Payer: Self-pay | Admitting: Gastroenterology

## 2022-01-31 ENCOUNTER — Telehealth: Payer: Self-pay | Admitting: Nurse Practitioner

## 2022-01-31 ENCOUNTER — Ambulatory Visit: Payer: Commercial Managed Care - PPO | Admitting: Nurse Practitioner

## 2022-01-31 ENCOUNTER — Encounter: Payer: Self-pay | Admitting: Nurse Practitioner

## 2022-01-31 VITALS — BP 124/78 | HR 94 | Ht 64.0 in | Wt 266.0 lb

## 2022-01-31 DIAGNOSIS — G4733 Obstructive sleep apnea (adult) (pediatric): Secondary | ICD-10-CM

## 2022-01-31 DIAGNOSIS — I10 Essential (primary) hypertension: Secondary | ICD-10-CM | POA: Diagnosis not present

## 2022-01-31 DIAGNOSIS — R490 Dysphonia: Secondary | ICD-10-CM

## 2022-01-31 DIAGNOSIS — R7303 Prediabetes: Secondary | ICD-10-CM

## 2022-01-31 DIAGNOSIS — M1712 Unilateral primary osteoarthritis, left knee: Secondary | ICD-10-CM

## 2022-01-31 DIAGNOSIS — N529 Male erectile dysfunction, unspecified: Secondary | ICD-10-CM

## 2022-01-31 DIAGNOSIS — C499 Malignant neoplasm of connective and soft tissue, unspecified: Secondary | ICD-10-CM

## 2022-01-31 DIAGNOSIS — F1190 Opioid use, unspecified, uncomplicated: Secondary | ICD-10-CM

## 2022-01-31 DIAGNOSIS — E785 Hyperlipidemia, unspecified: Secondary | ICD-10-CM | POA: Diagnosis not present

## 2022-01-31 NOTE — Assessment & Plan Note (Signed)
Will use tramadol twice daily as needed along with cyclobenzaprine.  No red flags today noted.  PDMP was reviewed

## 2022-01-31 NOTE — Assessment & Plan Note (Signed)
Patient currently on Cardura, chlorthalidone, and amlodipine.  Tolerates medication well.  Blood pressure within normal limits in office today.  Continue medication as prescribed

## 2022-01-31 NOTE — Patient Instructions (Signed)
Nice to see you today I want to see you in 3 months, sooner if you need me. We will do blood work at that visit If you need refills before you see me let me know

## 2022-01-31 NOTE — Assessment & Plan Note (Signed)
Patient is followed regularly by oncology Dr. Merrie Roof states that he did undergo radiation in the beginning but no chemotherapy.  Has a scan coming up in the next couple months with oncology.  Follow-up with oncology as recommended

## 2022-01-31 NOTE — Telephone Encounter (Signed)
Patient states that he never got a call from the ENT folks. The referral was placed on 09/2021 by Dr. Einar Pheasant. Could you check on that please

## 2022-01-31 NOTE — Assessment & Plan Note (Signed)
Tramadol 50 mg twice daily as needed no red flags on PDMP or in office today

## 2022-01-31 NOTE — Assessment & Plan Note (Signed)
Could be multifactorial given patient's body habitus, comorbidities, medications.  Patient currently using sildenafil with some relief

## 2022-01-31 NOTE — Progress Notes (Signed)
.  Established Patient Office Visit  Subjective   Patient ID: Scott York, male    DOB: January 13, 1965  Age: 57 y.o. MRN: [id number]  Chief Complaint  Patient presents with   Establish Care    HPI  Transfer of care: Last seen by Waunita Schooner, MD on 10/01/2021  HTN: States that he will check bp at home on occasion. Cardura, chlorthalidone and amlodipine.  Patient is tolerating medications well per his report.  HLD: Atrovastatin and tolerates it fine. States meals depends on work and what he is doing. States that he will drink coffee most mornings. Water and some ginger ale at night   ED: States that the medication is helpful. States getting and maintaing erectoin is what helps him.  We also discussed the possibility of some of his antihypertensive medications aiding in the ED  OA of knee: both knees. Patient is not currently followed by ortho. States that he will take tramadol and cyclobenzaprine  Hoarseness of voice: CPAP. States that he did change CPAP masks. States that the other mask was discontiue. States it has went away a little. Never got a call about ent  Liposarcoma: Reva Bores. States that she sees her when he gets his scan done. States next scan is March. States that he did radiation in the past  Eye: has been 2 years. He wears prescription glasses    Review of Systems  Constitutional:  Negative for chills and fever.  Respiratory:  Negative for shortness of breath.   Cardiovascular:  Negative for chest pain.  Gastrointestinal:  Negative for abdominal pain, blood in stool, constipation, diarrhea, nausea and vomiting.       Bm daily   Genitourinary:  Positive for urgency. Negative for dysuria and hematuria.  Neurological:  Negative for headaches.  Psychiatric/Behavioral:  Negative for hallucinations and suicidal ideas.       Objective:     BP 124/78   Pulse 94   Ht '5\' 4"'$  (1.626 m)   Wt 266 lb (120.7 kg)   SpO2 97%   BMI 45.66 kg/m  BP Readings from  Last 3 Encounters:  01/31/22 124/78  12/09/21 114/79  10/01/21 120/80   Wt Readings from Last 3 Encounters:  01/31/22 266 lb (120.7 kg)  12/09/21 268 lb (121.6 kg)  11/19/21 268 lb (121.6 kg)      Physical Exam Vitals and nursing note reviewed.  Constitutional:      Appearance: Normal appearance. He is obese.  HENT:     Right Ear: Tympanic membrane, ear canal and external ear normal.     Left Ear: Tympanic membrane, ear canal and external ear normal.     Mouth/Throat:     Mouth: Mucous membranes are moist.     Pharynx: Oropharynx is clear.  Eyes:     Extraocular Movements: Extraocular movements intact.     Pupils: Pupils are equal, round, and reactive to light.     Comments: Wears glasses   Cardiovascular:     Rate and Rhythm: Normal rate and regular rhythm.     Heart sounds: Normal heart sounds.  Pulmonary:     Effort: Pulmonary effort is normal.     Breath sounds: Normal breath sounds.  Abdominal:     General: Bowel sounds are normal.  Musculoskeletal:       Legs:     Comments: Clicking and popping appreciated on left knee with flexion and extension   Lymphadenopathy:     Cervical: No cervical adenopathy.  Skin:  General: Skin is warm.  Neurological:     General: No focal deficit present.     Mental Status: He is alert.     Comments: Bilateral upper and lower extremity strength 5/5      No results found for any visits on 01/31/22.    The ASCVD Risk score (Arnett DK, et al., 2019) failed to calculate for the following reasons:   The valid total cholesterol range is 130 to 320 mg/dL    Assessment & Plan:   Problem List Items Addressed This Visit       Cardiovascular and Mediastinum   Hypertension - Primary    Patient currently on Cardura, chlorthalidone, and amlodipine.  Tolerates medication well.  Blood pressure within normal limits in office today.  Continue medication as prescribed        Respiratory   OSA on CPAP    Adherent to therapy.   Continue        Musculoskeletal and Integument   Primary osteoarthritis of left knee (Chronic)    Will use tramadol twice daily as needed along with cyclobenzaprine.  No red flags today noted.  PDMP was reviewed        Other   Hyperlipidemia    Patient currently maintained on atorvastatin medication.  Tolerates it well      Erectile dysfunction    Could be multifactorial given patient's body habitus, comorbidities, medications.  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 Merrie Roof states that he did undergo radiation in the beginning but no chemotherapy.  Has a scan coming up in the next couple months with oncology.  Follow-up with oncology as recommended      Hoarseness of voice    States improved some but still there.  Never got a call for ENT referral.  I sent a message to [facility] referral coordinator pool for them to follow-up on this       Return in about 3 months (around 05/02/2022) for Medication check and basic labs.    Romilda Garret, NP

## 2022-01-31 NOTE — Assessment & Plan Note (Signed)
States improved some but still there.  Never got a call for ENT referral.  I sent a message to [facility] referral coordinator pool for them to follow-up on this

## 2022-01-31 NOTE — Assessment & Plan Note (Signed)
Adherent to therapy.  Continue

## 2022-01-31 NOTE — Assessment & Plan Note (Signed)
Patient currently maintained on atorvastatin medication.  Tolerates it well

## 2022-02-20 ENCOUNTER — Telehealth: Payer: Self-pay | Admitting: Nurse Practitioner

## 2022-02-20 DIAGNOSIS — G4733 Obstructive sleep apnea (adult) (pediatric): Secondary | ICD-10-CM

## 2022-02-20 NOTE — Telephone Encounter (Signed)
Patient would like to know if he can receive a referral to get his cpap machine. It has been over 3 years since he was seen at the place where he receives it at,and they are requiring a referral.    -Cpap supplies were from [place]  -Referral needs to be sent to [facility] [place]  Requesting phone call if referral is able to be placed

## 2022-02-20 NOTE — Telephone Encounter (Signed)
Called and informed pt that the referral has been placed.

## 2022-02-20 NOTE — Telephone Encounter (Signed)
Referral placed for [facility] to see Dr. Dia Sitter

## 2022-04-08 ENCOUNTER — Other Ambulatory Visit: Payer: Self-pay

## 2022-04-08 DIAGNOSIS — F1190 Opioid use, unspecified, uncomplicated: Secondary | ICD-10-CM

## 2022-04-08 DIAGNOSIS — M1712 Unilateral primary osteoarthritis, left knee: Secondary | ICD-10-CM

## 2022-04-08 NOTE — Telephone Encounter (Signed)
traMADol (ULTRAM) 50 MG tablet Last visit 01/31/2022 Next Visit 05/02/2022 Called pt and he stated that he needs refill

## 2022-04-10 MED ORDER — TRAMADOL HCL 50 MG PO TABS: 50.0000 mg | ORAL_TABLET | Freq: Two times a day (BID) | ORAL | 0 refills | Status: DC | PRN

## 2022-04-11 ENCOUNTER — Encounter: Payer: Self-pay | Admitting: Nurse Practitioner

## 2022-04-11 ENCOUNTER — Other Ambulatory Visit: Payer: Self-pay | Admitting: Nurse Practitioner

## 2022-04-11 DIAGNOSIS — M1712 Unilateral primary osteoarthritis, left knee: Secondary | ICD-10-CM

## 2022-04-11 DIAGNOSIS — F1190 Opioid use, unspecified, uncomplicated: Secondary | ICD-10-CM

## 2022-04-11 MED ORDER — TRAMADOL HCL 50 MG PO TABS: 50.0000 mg | ORAL_TABLET | Freq: Two times a day (BID) | ORAL | 0 refills | Status: AC | PRN

## 2022-04-11 NOTE — Telephone Encounter (Signed)
Patient called in and stated that he needs a prior auth for this medication. Thank you!

## 2022-04-11 NOTE — Progress Notes (Signed)
Called and canceled the 30 day supply as patient will be out of medication.

## 2022-04-11 NOTE — Telephone Encounter (Signed)
error 

## 2022-04-23 NOTE — Telephone Encounter (Signed)
Prescription Request  04/23/2022  LOV: Visit date not found  What is the name of the medication or equipment? cyclobenzaprine (FLEXERIL) 10 MG tablet &  traMADol (ULTRAM) 50 MG tablet   Have you contacted your pharmacy to request a refill? No   Which pharmacy would you like this sent to?  [id number] [place], [place] - [address] [place] [address] Phone: [phone] Fax: [phone]    Patient notified that their request is being sent to the clinical staff for review and that they should receive a response within 2 business days.   Please advise at Mobile [phone] (mobile)

## 2022-04-28 ENCOUNTER — Encounter: Payer: PRIVATE HEALTH INSURANCE | Admitting: Adult Health

## 2022-05-02 ENCOUNTER — Encounter: Payer: Self-pay | Admitting: Nurse Practitioner

## 2022-05-02 ENCOUNTER — Ambulatory Visit: Payer: Commercial Managed Care - PPO | Admitting: Nurse Practitioner

## 2022-05-02 VITALS — BP 132/78 | HR 86 | Temp 97.6°F | Resp 16 | Ht 64.0 in | Wt 269.0 lb

## 2022-05-02 DIAGNOSIS — G4733 Obstructive sleep apnea (adult) (pediatric): Secondary | ICD-10-CM | POA: Diagnosis not present

## 2022-05-02 DIAGNOSIS — M1712 Unilateral primary osteoarthritis, left knee: Secondary | ICD-10-CM | POA: Diagnosis not present

## 2022-05-02 DIAGNOSIS — F1190 Opioid use, unspecified, uncomplicated: Secondary | ICD-10-CM

## 2022-05-02 DIAGNOSIS — I10 Essential (primary) hypertension: Secondary | ICD-10-CM

## 2022-05-02 DIAGNOSIS — R490 Dysphonia: Secondary | ICD-10-CM

## 2022-05-02 LAB — CBC
HCT: 42.9 % (ref 39.0–52.0)
Hemoglobin: 14.4 g/dL (ref 13.0–17.0)
MCHC: 33.6 g/dL (ref 30.0–36.0)
MCV: 87 fl (ref 78.0–100.0)
Platelets: 238 10*3/uL (ref 150.0–400.0)
RBC: 4.93 Mil/uL (ref 4.22–5.81)
RDW: 14 % (ref 11.5–15.5)
WBC: 5.6 10*3/uL (ref 4.0–10.5)

## 2022-05-02 LAB — COMPREHENSIVE METABOLIC PANEL
ALT: 29 U/L (ref 0–53)
AST: 26 U/L (ref 0–37)
Albumin: 4.1 g/dL (ref 3.5–5.2)
Alkaline Phosphatase: 94 U/L (ref 39–117)
BUN: 15 mg/dL (ref 6–23)
CO2: 27 mEq/L (ref 19–32)
Calcium: 9.3 mg/dL (ref 8.4–10.5)
Chloride: 104 mEq/L (ref 96–112)
Creatinine, Ser: 0.92 mg/dL (ref 0.40–1.50)
GFR: 92.38 mL/min (ref >60.00)
Glucose, Bld: 100 mg/dL — ABNORMAL HIGH (ref 70–99)
Potassium: 3.3 mEq/L — ABNORMAL LOW (ref 3.5–5.1)
Sodium: 140 mEq/L (ref 135–145)
Total Bilirubin: 0.5 mg/dL (ref 0.2–1.2)
Total Protein: 7.1 g/dL (ref 6.0–8.3)

## 2022-05-02 MED ORDER — CYCLOBENZAPRINE HCL 10 MG PO TABS: 10.0000 mg | ORAL_TABLET | Freq: Two times a day (BID) | ORAL | 1 refills | Status: DC

## 2022-05-02 MED ORDER — AMLODIPINE BESYLATE 10 MG PO TABS: 10.0000 mg | ORAL_TABLET | Freq: Every day | ORAL | 2 refills | Status: DC

## 2022-05-02 MED ORDER — TRAMADOL HCL 50 MG PO TABS: 50.0000 mg | ORAL_TABLET | Freq: Two times a day (BID) | ORAL | 2 refills | Status: DC

## 2022-05-02 NOTE — Assessment & Plan Note (Signed)
Patient currently maintained on tramadol 50 mg twice daily as needed and Flexeril twice daily as needed.  Continue both medications refills provided today

## 2022-05-02 NOTE — Progress Notes (Signed)
Established Patient Office Visit  Subjective   Patient ID: Scott York, male    DOB: 1965/11/08  Age: 57 y.o. MRN: [id number]  Chief Complaint  Patient presents with   Medication Refill    HPI  OA knees: patient is currently maintained on tramadol 50mg  BID prn an will use cyclobenzaprine.  Patient tolerates the medication well.  Still able to function and work  HTN: Currently on cardura, chlorthalidone, and amlodipine.  Blood pressure under great control today  Hoarseness: was referred to ENT. Has had to switch CPAP mask states that it comes an goes. Has not heard from ENT. Has appt with Neurology on 05/08/2022     Review of Systems  Constitutional:  Negative for chills and fever.  Respiratory:  Negative for shortness of breath.   Cardiovascular:  Negative for chest pain.  Gastrointestinal:  Negative for abdominal pain, constipation, diarrhea, nausea and vomiting.  Musculoskeletal:  Positive for joint pain.  Neurological:  Negative for headaches.  Psychiatric/Behavioral:  Negative for hallucinations and suicidal ideas.       Objective:     BP 132/78   Pulse 86   Temp 97.6 F (36.4 C)   Resp 16   Ht 5\' 4"  (1.626 m)   Wt 269 lb (122 kg)   SpO2 98%   BMI 46.17 kg/m  BP Readings from Last 3 Encounters:  05/02/22 132/78  01/31/22 124/78  12/09/21 114/79   Wt Readings from Last 3 Encounters:  05/02/22 269 lb (122 kg)  01/31/22 266 lb (120.7 kg)  12/09/21 268 lb (121.6 kg)      Physical Exam Vitals and nursing note reviewed.  Constitutional:      Appearance: Normal appearance.  Cardiovascular:     Rate and Rhythm: Normal rate and regular rhythm.     Heart sounds: Normal heart sounds.  Pulmonary:     Effort: Pulmonary effort is normal.     Breath sounds: Normal breath sounds.  Abdominal:     General: Bowel sounds are normal.  Neurological:     Mental Status: He is alert.      No results found for any visits on 05/02/22.    The ASCVD Risk  score (Arnett DK, et al., 2019) failed to calculate for the following reasons:   The valid total cholesterol range is 130 to 320 mg/dL    Assessment & Plan:   Problem List Items Addressed This Visit       Cardiovascular and Mediastinum   Hypertension    Patient currently on Cardura, amlodipine and chlorthalidone.  Continue medication as prescribed      Relevant Medications   amLODipine (NORVASC) 10 MG tablet     Respiratory   OSA on CPAP    Patient has appointment on 05/08/2022 with neurology sleep medicine to evaluate supplies        Musculoskeletal and Integument   Primary osteoarthritis of left knee (Chronic)    Patient currently maintained on tramadol 50 mg twice daily as needed and Flexeril twice daily as needed.  Continue both medications refills provided today      Relevant Medications   traMADol (ULTRAM) 50 MG tablet   cyclobenzaprine (FLEXERIL) 10 MG tablet     Other   Morbid obesity (HCC)   Chronic narcotic use    PDMP reviewed no red flags      Hoarseness of voice    States is intermittent and improved some he will wait and see what sleep doctor says prior  to pursuing ENT appointment.      Other Visit Diagnoses     Essential hypertension    -  Primary   Relevant Medications   amLODipine (NORVASC) 10 MG tablet   Other Relevant Orders   CBC   Comprehensive metabolic panel       Return in about 6 months (around 11/01/2022) for CPE and Labs.    Audria Nine, NP

## 2022-05-02 NOTE — Assessment & Plan Note (Signed)
Patient currently on Cardura, amlodipine and chlorthalidone.  Continue medication as prescribed

## 2022-05-02 NOTE — Patient Instructions (Signed)
Nice to see you today I have sent in the tramadol, flexeril, and amlodipine to the pharmacy Follow up with me in 6 months for physical and fasting labs

## 2022-05-02 NOTE — Assessment & Plan Note (Signed)
PDMP reviewed no red flags

## 2022-05-02 NOTE — Assessment & Plan Note (Signed)
Patient has appointment on 05/08/2022 with neurology sleep medicine to evaluate supplies

## 2022-05-02 NOTE — Assessment & Plan Note (Signed)
States is intermittent and improved some he will wait and see what sleep doctor says prior to pursuing ENT appointment.

## 2022-05-06 ENCOUNTER — Other Ambulatory Visit: Payer: Self-pay | Admitting: Nurse Practitioner

## 2022-05-06 DIAGNOSIS — E876 Hypokalemia: Secondary | ICD-10-CM

## 2022-05-06 MED ORDER — POTASSIUM CHLORIDE CRYS ER 20 MEQ PO TBCR: 40.0000 meq | EXTENDED_RELEASE_TABLET | Freq: Once | ORAL | 0 refills | Status: DC

## 2022-05-08 ENCOUNTER — Encounter: Payer: Self-pay | Admitting: Neurology

## 2022-05-08 ENCOUNTER — Ambulatory Visit: Payer: Commercial Managed Care - PPO | Admitting: Neurology

## 2022-05-08 ENCOUNTER — Telehealth: Payer: Self-pay | Admitting: Neurology

## 2022-05-08 VITALS — BP 121/76 | HR 75 | Ht 65.0 in | Wt 268.1 lb

## 2022-05-08 DIAGNOSIS — G4733 Obstructive sleep apnea (adult) (pediatric): Secondary | ICD-10-CM | POA: Diagnosis not present

## 2022-05-08 NOTE — Patient Instructions (Addendum)
Please continue using your autoPAP regularly. While your insurance requires that you use PAP at least 4 hours each night on 70% of the nights, I recommend, that you not skip any nights and use it throughout the night if you can. Getting used to PAP and staying with the treatment long term does take time and patience and discipline. Untreated obstructive sleep apnea when it is moderate to severe can have an adverse impact on cardiovascular health and raise her risk for heart disease, arrhythmias, hypertension, congestive heart failure, stroke and diabetes. Untreated obstructive sleep apnea causes sleep disruption, nonrestorative sleep, and sleep deprivation. This can have an impact on your day to day functioning and cause daytime sleepiness and impairment of cognitive function, memory loss, mood disturbance, and problems focussing. Using PAP regularly can improve these symptoms.  We can see you in 1 year, you can see one of our nurse practitioners as you are stable.    Please talk to your PCP about seeing ENT for hoarseness. This is not a common issue with CPAP therapy.

## 2022-05-08 NOTE — Progress Notes (Signed)
Subjective:    Patient ID: Scott York is a 57 y.o. male.  HPI    Scott York, Scott York, Scott York [facility] 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  The patient has not been seen in our clinic in over 3 years. He had a home sleep test on 04/09/2018 which indicated moderate obstructive sleep apnea with an AHI of 23.8/h, O2 nadir of 84%.  He has been on AutoPap therapy since mid 2020.  His Epworth sleepiness score is 1 out of 24.  I reviewed his AutoPap compliance data from 04/07/2022 through 05/06/2022, which is a total of 30 days, during which time he used his machine every night with percent use days greater than 4 hours at 100%, indicating fair compliance, average usage of 7 hours and 19 minutes, residual AHI at goal at 1.1/h, average pressure for the 95th percentile at 12.7 cm with a range of 7 to 13 cm with EPR of 3.  His leak is on the low side with the 95th percentile at 5 L/min.  He reports doing well with his AutoPap therapy.  He has no new concerns but needs new supplies.  Bedtime is generally around 10 or 10:30 PM and rise time is around 5:30 AM.  He has no nightly nocturia.  He denies any recurrent morning headaches.  He is doing well with his AutoPap and generally does not go without it.  He is interested in getting a secondary machine for travel as he travels a lot for work.  He works as a Hydrologist.  He drinks caffeine in the form of coffee, usually 32 ounces in the morning.  He had interim  cancer surgery.  He had a myxoid liposarcoma removed from his left upper back.  He had surgery through [facility] on 03/04/2021 and subsequent radiation therapy, no chemotherapy.  He has a checkup in the next couple of months with his oncologist.  He reports ongoing issues with intermittent hoarseness.  His previous primary care thought it was a reaction to turmeric which he has since then stopped but still has hoarseness from time to time.  He has not seen ENT yet. He has had significant weight fluctuation in the past 3 to 4 years. He no longer smokes cigars.  He has not smoked a cigar in over a year.  He has not had any alcohol since 2006.  He needs new supplies, he likes to use the DreamWear full facemask from [facility], size medium.  His set up date of his current AutoPap was April 2020.  Previously (copied from previous notes for reference):    He saw [name], NP on 02/08/2019, at which time he was compliant with his AutoPap of 7 to 13 cm with EPR of 3.  Apnea was under control and he was doing well.  He was advised to follow-up routinely in 1 year.on   07/12/2018: (He) presents for follow-up consultation of his obstructive sleep apnea after recent home sleep testing and starting AutoPap therapy.  The patient is unaccompanied today.  I first met him  on 03/31/2018 and virtual visit at the request of his primary care nurse practitioner, at which time he reported that he was advised to have a sleep study done due to a concern at the time of his DOT physical.  The patient had a home sleep test on 04/09/2018 which indicated moderate obstructive sleep apnea with an AHI of 23.8/h, O2 nadir of 84%.  He was advised to start AutoPap therapy.    I reviewed his AutoPap compliance data from 06/08/2018 through 07/07/2018 which is a total of 30 days, during which time he used his AutoPap 26 days with percent used days greater than 4 hours at 87%, indicating very good compliance with average usage of 7  hours and 57 minutes, residual AHI at goal at 2.1/h, leak on the low side with a 95th percentile at 3.1 L/min, 95th percentile of pressure at 12.7 cm with a range of 7 cm to 13 cm with EPR.  He reports being fully compliant with his AutoPap, with the exception of Fridays as he dedicates this day of the week to his wife he reports.  He has not noticed much in the way of difference in his daytime symptoms but admits that his sleep quality, sleep consolidation and nocturia are better.  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     03/31/2018: His Epworth sleepiness score is 5 out of 24, fatigue severity score is 13 out of 63. 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 He was advised to seek a sleep study based on a concern on the physical exam for high risk for sleep apnea, he reports that his neck circumference at the DOT physical was 17-1/2 inches. Since then, he has been trying to lose weight, in January 2020 his weight was recorded at 271.4 pounds at his primary care visit, he reports a current weight of 249 pounds. He estimates that currently as far as his own measure of neck size, his neck circumference is about 16-1/4 or 16-1/2 inches. He had a tonsillectomy as a child, is not aware of any family history of OSA. He is a nonsmoker, has smoked the occasional cigar, quit drinking alcohol altogether in 2006 because when he drank alcohol he would drink too much she admits. He drinks caffeine in the form of coffee, up to 24 ounces per day, may need to drink more water he admits. He drinks soda but without caffeine typically. Bedtime is around 9, typically he is asleep by 10, he does watch TV in the bedroom but turns it off. His rise time is between 5:30 and 6. He has nocturia about twice per average night and denies morning headaches. His wife has voiced the occasional concern  about pauses in his breathing and he does snore. He lives with his wife, has 3 biological children. In his household currently he has a step/adopted daughter, her fianc, and a grandchild also lives in the household. He has no back pain and is a restless sleeper. He has had no back surgeries, he does take tramadol twice daily for his pain and is also status post left total knee replacement.     His Past Medical History Is Significant For: Past Medical History:  Diagnosis Date   Cancer (HCC)    Cholelithiasis    DDD (degenerative disc disease), lumbar    L5-S1  Hyperlipidemia    Hypertension    Obesity    Sleep apnea    UTI (lower urinary tract infection)     His Past Surgical History Is Significant For: Past Surgical History:  Procedure Laterality Date   APPENDECTOMY  04/20/2011   Procedure: APPENDECTOMY;  Surgeon: Emelia Loron, Scott York;  Location: [facility] OR;  Service: General;  Laterality: N/A;  Opened @1855 .   FRACTURE SURGERY Right    Clavicle   JOINT REPLACEMENT N/A    Phreesia 07/04/2019   KNEE ARTHROSCOPY W/ MENISCAL REPAIR Left    NOSE SURGERY     TONSILLECTOMY     TOTAL KNEE ARTHROPLASTY Left 07/16/2015   Procedure: TOTAL KNEE ARTHROPLASTY;  Surgeon: Jodi Geralds, Scott York;  Location: [facility] OR;  Service: Orthopedics;  Laterality: Left;   WISDOM TOOTH EXTRACTION      His Family History Is Significant For: Family History  Problem Relation Age of Onset   Heart attack Mother 41   Colon cancer Neg Hx    Colon polyps Neg Hx    Esophageal cancer Neg Hx    Rectal cancer Neg Hx    Stomach cancer Neg Hx     His Social History Is Significant For: Social History   Socioeconomic History   Marital status: Married    Spouse name: Tammy   Number of children: 3   Years of education: Some college   Highest education level: Some college, no degree  Occupational History   Not on file  Tobacco Use   Smoking status: Former    Types: Cigars    Quit date: 04/30/2011    Years since  quitting: 11.0   Smokeless tobacco: Never  Vaping Use   Vaping Use: Never used  Substance and Sexual Activity   Alcohol use: Not Currently   Drug use: No   Sexual activity: Yes  Other Topics Concern   Not on file  Social History Narrative   09/07/19   From: [place], moved here to be near parents   Living: with Babette Relic, wife (1997) and daughter   Work: Hydrologist      Family: 3 children - phillip, joshua, angela (still in college) - 1 grandchild      Enjoys: video games, ankle and knee keep him sitting      Exercise: not currently   Diet: needs to work on diet      Safety   Seat belts: Yes    Guns: yes and secure   Safe in relationships: Yes    Social Determinants of Health   Financial Resource Strain: Low Risk  (04/28/2022)   Overall Financial Resource Strain (CARDIA)    Difficulty of Paying Living Expenses: Not hard at all  Food Insecurity: No Food Insecurity (04/28/2022)   Hunger Vital Sign    Worried About Running Out of Food in the Last Year: Never true    Ran Out of Food in the Last Year: Never true  Transportation Needs: No Transportation Needs (04/28/2022)   PRAPARE - Administrator, Civil Service (Medical): No    Lack of Transportation (Non-Medical): No  Physical Activity: Unknown (04/28/2022)   Exercise Vital Sign    Days of Exercise per Week: 0 days    Minutes of Exercise per Session: Not on file  Stress: No Stress Concern Present (04/28/2022)   [employer] of Occupational Health - Occupational Stress Questionnaire    Feeling of Stress : Not at all  Social Connections: Moderately Isolated (04/28/2022)   Social  Connection and Isolation Panel [NHANES]    Frequency of Communication with Friends and Family: More than three times a week    Frequency of Social Gatherings with Friends and Family: Once a week    Attends Religious Services: Never    Database administrator or Organizations: No    Attends Engineer, structural: Not on file     Marital Status: Married    His Allergies Are:  Allergies  Allergen Reactions   Turmeric Anaphylaxis and Swelling  :   His Current Medications Are:  Outpatient Encounter Medications as of 05/08/2022  Medication Sig   amLODipine (NORVASC) 10 MG tablet Take 1 tablet (10 mg total) by mouth daily.   aspirin EC 81 MG tablet Take 81 mg by mouth daily.   atorvastatin (LIPITOR) 20 MG tablet Take 1 tablet (20 mg total) by mouth daily.   chlorthalidone (HYGROTON) 25 MG tablet Take 1 tablet (25 mg total) by mouth daily.   cyclobenzaprine (FLEXERIL) 10 MG tablet Take 1 tablet (10 mg total) by mouth 2 (two) times daily.   doxazosin (CARDURA) 2 MG tablet Take 1 tablet (2 mg total) by mouth daily.   Multiple Vitamin (MULITIVITAMIN WITH MINERALS) TABS Take 1 tablet by mouth daily.   naproxen sodium (ALEVE) 220 MG tablet Take 220 mg by mouth 2 (two) times daily as needed (for pain).   OMEGA 3 1200 MG CAPS Take 1,200 mg by mouth daily.    sildenafil (VIAGRA) 100 MG tablet Take 1 tablet (100 mg total) by mouth as needed for erectile dysfunction.   silver sulfADIAZINE (SILVADENE) 1 % cream    traMADol (ULTRAM) 50 MG tablet Take 1 tablet (50 mg total) by mouth 2 (two) times daily.   UNABLE TO FIND CPAP machine- At bedtime   potassium chloride SA (KLOR-CON M) 20 MEQ tablet Take 2 tablets (40 mEq total) by mouth once for 1 dose.   No facility-administered encounter medications on file as of 05/08/2022.  :   Review of Systems:  Out of a complete 14 point review of systems, all are reviewed and negative with the exception of these symptoms as listed below:  Review of Systems  Neurological:        Pt here for CPAP f/u Pt states CPAP going great Pt states no questions or concerns  for this appointment    ESS:1    Objective:  Neurological Exam  Physical Exam Physical Examination:   Vitals:   05/08/22 1302  BP: 121/76  Pulse: 75   General Examination: The patient is a very pleasant 57 y.o. male in  no acute distress. He appears well-developed and well-nourished and adequately groomed.   HEENT: Normocephalic, atraumatic, pupils are equal, round and reactive to light, corrective eyeglasses in place, tracking well-preserved, no obvious nystagmus.  Hearing grossly intact.  Face is symmetric with normal facial animation, mild hoarseness noted, no dysarthria or hypophonia.  No voice tremor.  Neck supple with full range of motion noted, no carotid bruits.  Airway examination reveals moderate mouth dryness, adequate dental hygiene with several missing teeth, moderate airway crowding.  Tongue protrudes centrally and palate elevates symmetrically.    Chest: Clear to auscultation without wheezing, rhonchi or crackles noted.   Heart: S1+S2+0, regular and normal without murmurs, rubs or gallops noted.    Abdomen: Soft, non-tender and non-distended.   Extremities: There is no pitting edema in the distal lower extremities bilaterally.    Skin: Warm and dry without trophic changes noted.  Musculoskeletal: exam reveals no obvious joint deformities.    Neurologically:  Mental status: The patient is awake, alert and oriented in all 4 spheres. His immediate and remote memory, attention, language skills and fund of knowledge are appropriate. There is no evidence of aphasia, agnosia, apraxia or anomia. Speech is clear with normal prosody and enunciation. Thought process is linear. Mood is normal and affect is normal.  Cranial nerves II - XII are as described above under HEENT exam. Motor exam: Normal bulk, strength and tone is noted. There is no resting or action tremor.   Fine motor skills and coordination: grossly intact.  Cerebellar testing: No dysmetria or intention tremor. There is no truncal or gait ataxia.  Sensory exam: intact to light touch in the upper and lower extremities.  Gait, station and balance: He stands easily. No veering to one side is noted. No leaning to one side is noted. Posture is  age-appropriate and stance is narrow based. Gait shows normal stride length and normal pace. No problems turning are noted.    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  His home sleep test from 04/09/2018 indicated moderate obstructive sleep apnea with an AHI of 23.8/h, O2 nadir of 84%.  He is compliant with his AutoPap.  He uses a fullface mask with good success.  He continues to benefit from treatment and is commended for his full treatment adherence.  He may be eligible for new machine by next year around this time.  He is advised to continue with his current AutoPap machine with full compliance and at the current settings.  I will write for new supplies.  He is advised to follow-up routinely to see one of our nurse practitioners in 1 year, we can offer him a video visit appointment if he is in [place] at the time.  He is advised that we can consider reevaluation next year and a new machine at the time.  I answered all his questions today and he was in agreement.

## 2022-05-08 NOTE — Telephone Encounter (Signed)
Pt is requesting a call from billing to discuss his bill

## 2022-05-09 ENCOUNTER — Encounter: Payer: Self-pay | Admitting: *Deleted

## 2022-05-13 ENCOUNTER — Other Ambulatory Visit (HOSPITAL_COMMUNITY): Payer: Self-pay

## 2022-05-13 NOTE — Telephone Encounter (Signed)
Per test claims, neither medicine require a PA and both have been filled recently.

## 2022-07-26 ENCOUNTER — Encounter: Payer: Self-pay | Admitting: Nurse Practitioner

## 2022-07-26 DIAGNOSIS — M1712 Unilateral primary osteoarthritis, left knee: Secondary | ICD-10-CM

## 2022-07-28 MED ORDER — TRAMADOL HCL 50 MG PO TABS
50.0000 mg | ORAL_TABLET | Freq: Two times a day (BID) | ORAL | 2 refills | Status: DC
Start: 2022-07-28 — End: 2023-01-28

## 2022-07-28 NOTE — Addendum Note (Signed)
Addended by: Eden Emms on: 07/28/2022 04:36 PM   Modules accepted: Orders

## 2022-08-31 ENCOUNTER — Other Ambulatory Visit: Payer: Self-pay | Admitting: Nurse Practitioner

## 2022-08-31 DIAGNOSIS — M1712 Unilateral primary osteoarthritis, left knee: Secondary | ICD-10-CM

## 2022-09-02 NOTE — Telephone Encounter (Signed)
Per chart review patient should have 1 more refill

## 2022-09-03 ENCOUNTER — Telehealth: Payer: Self-pay

## 2022-09-03 DIAGNOSIS — N529 Male erectile dysfunction, unspecified: Secondary | ICD-10-CM

## 2022-09-03 MED ORDER — SILDENAFIL CITRATE 100 MG PO TABS
100.0000 mg | ORAL_TABLET | ORAL | 0 refills | Status: DC | PRN
Start: 2022-09-03 — End: 2023-08-25

## 2022-09-03 NOTE — Addendum Note (Signed)
Addended by: Eden Emms on: 09/03/2022 01:10 PM   Modules accepted: Orders

## 2022-09-03 NOTE — Telephone Encounter (Signed)
LAST APPOINTMENT DATE: 05/02/22   NEXT APPOINTMENT DATE: 11/05/2022  Sildenafil 100mg    LAST REFILL: 10/01/2021  QTY: #30 0RF

## 2022-09-03 NOTE — Telephone Encounter (Signed)
Refill provided

## 2022-10-15 ENCOUNTER — Telehealth: Payer: Self-pay

## 2022-10-15 DIAGNOSIS — E782 Mixed hyperlipidemia: Secondary | ICD-10-CM

## 2022-10-15 NOTE — Telephone Encounter (Signed)
LAST APPOINTMENT DATE: 05/02/22   NEXT APPOINTMENT DATE: 11/05/2022   Atorvastatin  LAST REFILL: 10/01/21  QTY: #90 3RF

## 2022-10-16 ENCOUNTER — Telehealth: Payer: Self-pay | Admitting: Nurse Practitioner

## 2022-10-16 DIAGNOSIS — I10 Essential (primary) hypertension: Secondary | ICD-10-CM

## 2022-10-16 MED ORDER — CHLORTHALIDONE 25 MG PO TABS
25.0000 mg | ORAL_TABLET | Freq: Every day | ORAL | 3 refills | Status: DC
Start: 2022-10-16 — End: 2023-10-08

## 2022-10-16 MED ORDER — DOXAZOSIN MESYLATE 2 MG PO TABS
2.0000 mg | ORAL_TABLET | Freq: Every day | ORAL | 3 refills | Status: DC
Start: 2022-10-16 — End: 2023-10-08

## 2022-10-16 MED ORDER — ATORVASTATIN CALCIUM 20 MG PO TABS
20.0000 mg | ORAL_TABLET | Freq: Every day | ORAL | 3 refills | Status: DC
Start: 2022-10-16 — End: 2023-10-08

## 2022-10-16 NOTE — Telephone Encounter (Signed)
Prescription Request  10/16/2022  LOV: 05/02/2022  What is the name of the medication or equipment?  chlorthalidone (HYGROTON) 25 MG tablet  doxazosin (CARDURA) 2 MG tablet  Have you contacted your pharmacy to request a refill? No   Which pharmacy would you like this sent to?  [id number] [place], [place] - [address] [place] [address] Phone: [phone] Fax: [phone]    Patient notified that their request is being sent to the clinical staff for review and that they should receive a response within 2 business days.   Please advise at Mobile [phone] (mobile)

## 2022-10-16 NOTE — Addendum Note (Signed)
Addended by: Eden Emms on: 10/16/2022 01:38 PM   Modules accepted: Orders

## 2022-10-16 NOTE — Telephone Encounter (Signed)
LAST APPOINTMENT DATE: 05/02/22   NEXT APPOINTMENT DATE: 11/05/2022   Chlorthalidone 25 mg LAST REFILL: 10/01/21  QTY: #90 3RF  Doxazosin 2mg   LAST REFILL: 10/01/21  QTY: #90 3RF

## 2022-10-16 NOTE — Telephone Encounter (Signed)
Refill provided

## 2022-10-16 NOTE — Telephone Encounter (Signed)
Refills provided 

## 2022-11-05 ENCOUNTER — Ambulatory Visit (INDEPENDENT_AMBULATORY_CARE_PROVIDER_SITE_OTHER): Payer: Commercial Managed Care - PPO | Admitting: Nurse Practitioner

## 2022-11-05 ENCOUNTER — Encounter: Payer: Self-pay | Admitting: Nurse Practitioner

## 2022-11-05 VITALS — BP 130/84 | HR 70 | Temp 97.8°F | Ht 65.0 in | Wt 259.0 lb

## 2022-11-05 DIAGNOSIS — M1712 Unilateral primary osteoarthritis, left knee: Secondary | ICD-10-CM | POA: Diagnosis not present

## 2022-11-05 DIAGNOSIS — G4733 Obstructive sleep apnea (adult) (pediatric): Secondary | ICD-10-CM

## 2022-11-05 DIAGNOSIS — R7303 Prediabetes: Secondary | ICD-10-CM

## 2022-11-05 DIAGNOSIS — C499 Malignant neoplasm of connective and soft tissue, unspecified: Secondary | ICD-10-CM

## 2022-11-05 DIAGNOSIS — Z87891 Personal history of nicotine dependence: Secondary | ICD-10-CM | POA: Diagnosis not present

## 2022-11-05 DIAGNOSIS — Z125 Encounter for screening for malignant neoplasm of prostate: Secondary | ICD-10-CM

## 2022-11-05 DIAGNOSIS — E785 Hyperlipidemia, unspecified: Secondary | ICD-10-CM | POA: Diagnosis not present

## 2022-11-05 DIAGNOSIS — I10 Essential (primary) hypertension: Secondary | ICD-10-CM

## 2022-11-05 DIAGNOSIS — Z0000 Encounter for general adult medical examination without abnormal findings: Secondary | ICD-10-CM | POA: Diagnosis not present

## 2022-11-05 DIAGNOSIS — N529 Male erectile dysfunction, unspecified: Secondary | ICD-10-CM

## 2022-11-05 DIAGNOSIS — F1190 Opioid use, unspecified, uncomplicated: Secondary | ICD-10-CM

## 2022-11-05 LAB — COMPREHENSIVE METABOLIC PANEL
ALT: 27 U/L (ref 0–53)
AST: 23 U/L (ref 0–37)
Albumin: 4.1 g/dL (ref 3.5–5.2)
Alkaline Phosphatase: 109 U/L (ref 39–117)
BUN: 16 mg/dL (ref 6–23)
CO2: 31 meq/L (ref 19–32)
Calcium: 9.8 mg/dL (ref 8.4–10.5)
Chloride: 102 meq/L (ref 96–112)
Creatinine, Ser: 0.82 mg/dL (ref 0.40–1.50)
GFR: 97.2 mL/min (ref >60.00)
Glucose, Bld: 88 mg/dL (ref 70–99)
Potassium: 3.8 meq/L (ref 3.5–5.1)
Sodium: 141 meq/L (ref 135–145)
Total Bilirubin: 0.6 mg/dL (ref 0.2–1.2)
Total Protein: 7 g/dL (ref 6.0–8.3)

## 2022-11-05 LAB — LIPID PANEL
Cholesterol: 125 mg/dL (ref 0–200)
HDL: 54.1 mg/dL (ref >39.00)
LDL Cholesterol: 56 mg/dL (ref 0–99)
NonHDL: 71.25
Total CHOL/HDL Ratio: 2
Triglycerides: 77 mg/dL (ref 0.0–149.0)
VLDL: 15.4 mg/dL (ref 0.0–40.0)

## 2022-11-05 LAB — URINALYSIS, MICROSCOPIC ONLY

## 2022-11-05 LAB — CBC
HCT: 44.9 % (ref 39.0–52.0)
Hemoglobin: 14.6 g/dL (ref 13.0–17.0)
MCHC: 32.5 g/dL (ref 30.0–36.0)
MCV: 88.4 fL (ref 78.0–100.0)
Platelets: 248 10*3/uL (ref 150.0–400.0)
RBC: 5.08 Mil/uL (ref 4.22–5.81)
RDW: 14 % (ref 11.5–15.5)
WBC: 6.9 10*3/uL (ref 4.0–10.5)

## 2022-11-05 LAB — TSH: TSH: 2.25 u[IU]/mL (ref 0.35–5.50)

## 2022-11-05 LAB — HEMOGLOBIN A1C: Hgb A1c MFr Bld: 5.7 % (ref 4.6–6.5)

## 2022-11-05 LAB — PSA: PSA: 0.84 ng/mL (ref 0.10–4.00)

## 2022-11-05 NOTE — Assessment & Plan Note (Signed)
Same.  Pending A1c today.  Continue working on healthy lifestyle modifications

## 2022-11-05 NOTE — Patient Instructions (Signed)
Nice to see you today [facility] for the sildenafil  Consider getting the shingles vaccine Follow up with me in  months, sooner if you need me

## 2022-11-05 NOTE — Assessment & Plan Note (Signed)
Pending urine microscopy to rule out microscopic hematuria

## 2022-11-05 NOTE — Assessment & Plan Note (Signed)
History of same.  Pending lipid panel.  Continue atorvastatin as prescribed

## 2022-11-05 NOTE — Assessment & Plan Note (Signed)
Patient has been working on weight loss and had some success.  Continue working on lifestyle modifications.  Pending TSH, A1c, lipid panel today

## 2022-11-05 NOTE — Assessment & Plan Note (Signed)
Patient is regularly followed by oncology.  Continue following with specialist as recommended

## 2022-11-05 NOTE — Assessment & Plan Note (Signed)
Same patient currently maintained on tramadol 50 mg twice daily.  Continue he has been working losing weight has lost 10 pounds since last office visit.

## 2022-11-05 NOTE — Assessment & Plan Note (Signed)
Discussed age-appropriate immunizations and screening exams.  Patient is up-to-date on all age-appropriate vaccinations he would like.  He refused flu vaccine shingles vaccine today office.  Patient is up-to-date on CRC screening.  PSA placed today for prostate cancer screening.  Patient was given information at discharge about preventative healthcare maintenance with this before guidance

## 2022-11-05 NOTE — Progress Notes (Signed)
Established Patient Office Visit  Subjective   Patient ID: Scott York, male    DOB: 04/23/1965  Age: 57 y.o. MRN: [id number]  Chief Complaint  Patient presents with   Annual Exam    HPI  HTN: patient is currently on amlodipine, chlorthalidone, doxazosin. Checks it dialy and tolerates   Chronic knee pain: patient currently on tramadol 50mg  BID  ED: currently on Viagra 100mg  prn. State 6-10 times a month   OSA: CPAP nightly. 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  for complete physical and follow up of chronic conditions.  Immunizations: -Tetanus: Completed in 2023 -Influenza: refused  -Shingles: refused, discussed in office   -Pneumonia: too young   Diet: Fair diet. He is eating approx 2 meals a day. States that he will snack sometmes at night. He will drink water and diet gingerale and body armor  Exercise: No regular exercise.  Eye exam: Completes annually. Glasses  Dental exam: Completes semi-annually    Colonoscopy: Completed in 12/09/2021, recall 7 years Lung Cancer Screening: NA  PSA: Due  Sleep: he will go to bed around 10 and get up around 5-530. Feels rested. Does not snore       Review of Systems  Constitutional:  Negative for chills and fever.  Respiratory:  Negative for shortness of breath.   Cardiovascular:  Negative for chest pain and leg swelling.  Gastrointestinal:  Negative for abdominal pain, blood in stool, constipation, diarrhea, nausea and vomiting.       BM Daily   Genitourinary:  Negative for dysuria and hematuria.       Nocturia x 1  Neurological:  Negative for tingling and headaches.  Psychiatric/Behavioral:  Negative for hallucinations and suicidal ideas.       Objective:     BP 130/84   Pulse 70   Temp 97.8 F (36.6 C) (Oral)   Ht 5\' 5"  (1.651 m)   Wt 259 lb (117.5 kg)   SpO2 96%   BMI 43.10 kg/m  BP Readings from Last 3 Encounters:  11/05/22 130/84  05/08/22 121/76   05/02/22 132/78   Wt Readings from Last 3 Encounters:  11/05/22 259 lb (117.5 kg)  05/08/22 268 lb 1.6 oz (121.6 kg)  05/02/22 269 lb (122 kg)   SpO2 Readings from Last 3 Encounters:  11/05/22 96%  05/02/22 98%  01/31/22 97%      Physical Exam Vitals and nursing note reviewed.  Constitutional:      Appearance: Normal appearance.  HENT:     Right Ear: Tympanic membrane, ear canal and external ear normal.     Left Ear: Tympanic membrane, ear canal and external ear normal.     Mouth/Throat:     Mouth: Mucous membranes are moist.     Pharynx: Oropharynx is clear.  Eyes:     Extraocular Movements: Extraocular movements intact.     Pupils: Pupils are equal, round, and reactive to light.  Cardiovascular:     Rate and Rhythm: Normal rate and regular rhythm.     Pulses: Normal pulses.     Heart sounds: Normal heart sounds.  Pulmonary:     Effort: Pulmonary effort is normal.     Breath sounds: Normal breath sounds.  Abdominal:     General: Bowel sounds are normal. There is no distension.     Palpations: There is no mass.     Tenderness: There is no abdominal tenderness.     Hernia:  No hernia is present.  Musculoskeletal:     Right lower leg: No edema.     Left lower leg: No edema.  Lymphadenopathy:     Cervical: No cervical adenopathy.  Skin:    General: Skin is warm.  Neurological:     General: No focal deficit present.     Mental Status: He is alert.     Comments: Bilateral upper and lower extremity strength 5/5  Psychiatric:        Mood and Affect: Mood normal.        Behavior: Behavior normal.        Thought Content: Thought content normal.        Judgment: Judgment normal.      No results found for any visits on 11/05/22.    The ASCVD Risk score (Arnett DK, et al., 2019) failed to calculate for the following reasons:   The valid total cholesterol range is 130 to 320 mg/dL    Assessment & Plan:   Problem List Items Addressed This Visit        Cardiovascular and Mediastinum   Hypertension    Patient currently maintained on amlodipine 10 mg, chlorthalidone 25 mg, doxazosin 2 mg daily.  Patient does check blood pressure at home.  Blood pressure within normal limits tolerates medication well continue medication as prescribed      Relevant Medications   lisinopril (ZESTRIL) 40 MG tablet   hydrochlorothiazide (HYDRODIURIL) 12.5 MG tablet     Respiratory   OSA on CPAP    Patient currently adherent to CPAP therapy.  Gets about 7 hours of restorative sleep at night continue        Musculoskeletal and Integument   Primary osteoarthritis of left knee (Chronic)    Same patient currently maintained on tramadol 50 mg twice daily.  Continue he has been working losing weight has lost 10 pounds since last office visit.        Other   Hyperlipidemia    History of same.  Pending lipid panel.  Continue atorvastatin as prescribed      Relevant Medications   lisinopril (ZESTRIL) 40 MG tablet   hydrochlorothiazide (HYDRODIURIL) 12.5 MG tablet   Other Relevant Orders   Lipid panel   Erectile dysfunction    Currently maintained on Viagra 100 mg as needed.  Continue medication as prescribed      Morbid obesity (HCC)    Patient has been working on weight loss and had some success.  Continue working on lifestyle modifications.  Pending TSH, A1c, lipid panel today      Relevant Orders   Hemoglobin A1c   Lipid panel   Chronic narcotic use    PDMP reviewed no red flags.  Update controlled substance agreement today along with urine drug screen      Relevant Orders   DRUG MONITORING, PANEL 8 WITH CONFIRMATION, URINE   Prediabetes    Same.  Pending A1c today.  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  Continue following with specialist as recommended      Relevant Medications   ondansetron (ZOFRAN-ODT) 4 MG  disintegrating tablet   diazepam (VALIUM) 5 MG tablet   Preventative health care - Primary    Discussed age-appropriate immunizations and screening exams.  Patient is up-to-date on all age-appropriate vaccinations he would like.  He refused flu vaccine shingles  vaccine today office.  Patient is up-to-date on CRC screening.  PSA placed today for prostate cancer screening.  Patient was given information at discharge about preventative healthcare maintenance with this before guidance      Relevant Orders   CBC   Comprehensive metabolic panel   TSH   Former tobacco use    Pending urine microscopy to rule out microscopic hematuria      Relevant Orders   Urine Microscopic   Other Visit Diagnoses     Screening for prostate cancer       Relevant Orders   PSA       Return in about 6 months (around 05/06/2023) for Knee pain/medicatoin recheck .    Audria Nine, NP

## 2022-11-05 NOTE — Assessment & Plan Note (Signed)
Patient currently adherent to CPAP therapy.  Gets about 7 hours of restorative sleep at night continue

## 2022-11-05 NOTE — Assessment & Plan Note (Signed)
Currently maintained on Viagra 100 mg as needed.  Continue medication as prescribed

## 2022-11-05 NOTE — Assessment & Plan Note (Signed)
PDMP reviewed no red flags.  Update controlled substance agreement today along with urine drug screen

## 2022-11-05 NOTE — Assessment & Plan Note (Signed)
Patient currently maintained on amlodipine 10 mg, chlorthalidone 25 mg, doxazosin 2 mg daily.  Patient does check blood pressure at home.  Blood pressure within normal limits tolerates medication well continue medication as prescribed

## 2022-11-06 ENCOUNTER — Encounter: Payer: Self-pay | Admitting: Nurse Practitioner

## 2022-11-06 DIAGNOSIS — K1370 Unspecified lesions of oral mucosa: Secondary | ICD-10-CM

## 2022-11-06 LAB — DRUG MONITORING, PANEL 8 WITH CONFIRMATION, URINE
6 Acetylmorphine: NEGATIVE ng/mL (ref <10)
Alcohol Metabolites: NEGATIVE ng/mL (ref <500)
Amphetamines: NEGATIVE ng/mL (ref <500)
Benzodiazepines: NEGATIVE ng/mL (ref <100)
Buprenorphine, Urine: NEGATIVE ng/mL (ref <5)
Cocaine Metabolite: NEGATIVE ng/mL (ref <150)
Creatinine: 74.7 mg/dL (ref >=20.0)
MDMA: NEGATIVE ng/mL (ref <500)
Marijuana Metabolite: NEGATIVE ng/mL (ref <20)
Opiates: NEGATIVE ng/mL (ref <100)
Oxidant: NEGATIVE ug/mL (ref <200)
Oxycodone: NEGATIVE ng/mL (ref <100)
pH: 7.2 (ref 4.5–9.0)

## 2022-11-06 LAB — DM TEMPLATE

## 2022-11-07 MED ORDER — TRIAMCINOLONE ACETONIDE 0.1 % MT PSTE: 1.0000 | PASTE | Freq: Two times a day (BID) | OROMUCOSAL | 0 refills | Status: DC

## 2023-01-26 ENCOUNTER — Encounter: Payer: Self-pay | Admitting: Nurse Practitioner

## 2023-01-26 ENCOUNTER — Other Ambulatory Visit: Payer: Self-pay | Admitting: Nurse Practitioner

## 2023-01-26 DIAGNOSIS — M1712 Unilateral primary osteoarthritis, left knee: Secondary | ICD-10-CM

## 2023-01-28 MED ORDER — TRAMADOL HCL 50 MG PO TABS: 50.0000 mg | ORAL_TABLET | Freq: Two times a day (BID) | ORAL | 2 refills | Status: DC

## 2023-04-11 ENCOUNTER — Other Ambulatory Visit: Payer: Self-pay | Admitting: Nurse Practitioner

## 2023-04-11 DIAGNOSIS — I10 Essential (primary) hypertension: Secondary | ICD-10-CM

## 2023-05-06 ENCOUNTER — Ambulatory Visit: Payer: Commercial Managed Care - PPO | Admitting: Nurse Practitioner

## 2023-05-06 VITALS — BP 134/76 | HR 68 | Temp 97.9°F | Ht 65.0 in | Wt 279.6 lb

## 2023-05-06 DIAGNOSIS — F1190 Opioid use, unspecified, uncomplicated: Secondary | ICD-10-CM | POA: Diagnosis not present

## 2023-05-06 DIAGNOSIS — R21 Rash and other nonspecific skin eruption: Secondary | ICD-10-CM | POA: Insufficient documentation

## 2023-05-06 DIAGNOSIS — L980 Pyogenic granuloma: Secondary | ICD-10-CM | POA: Insufficient documentation

## 2023-05-06 DIAGNOSIS — I10 Essential (primary) hypertension: Secondary | ICD-10-CM

## 2023-05-06 DIAGNOSIS — M1712 Unilateral primary osteoarthritis, left knee: Secondary | ICD-10-CM | POA: Diagnosis not present

## 2023-05-06 MED ORDER — TRIAMCINOLONE ACETONIDE 0.1 % EX CREA: 1.0000 | TOPICAL_CREAM | Freq: Two times a day (BID) | CUTANEOUS | 0 refills | Status: DC

## 2023-05-06 NOTE — Assessment & Plan Note (Signed)
 Will try triamcinolone  0.1% cream twice daily for 7 to 10 days.  If no improvement he will follow-up with dermatology as referral was placed today.

## 2023-05-06 NOTE — Assessment & Plan Note (Signed)
 Patient has granuloma to the left lower inside lip.  He will check with his dentist to see if they will remove it but we will do an ambulatory referral to dermatology for evaluation.

## 2023-05-06 NOTE — Assessment & Plan Note (Signed)
 Patient currently maintained on tramadol  50 mg twice daily.  UDS up-to-date.  PDMP reviewed.  Will continue medication as is offering value in patient's life in regards to function of him being able to work.

## 2023-05-06 NOTE — Progress Notes (Signed)
 Established Patient Office Visit  Subjective   Patient ID: Scott York, male    DOB: November 26, 1965  Age: 58 y.o. MRN: [id number]  Chief Complaint  Patient presents with   medication check    HPI  HTN: amlodipine  10 mg, chlorthalidone  25mg , doxazosin  2mg . Does check blood pressure at home.     Knee pain/Chronic back pain: patient is currently on tramadol  50mg  BID. Does have a BM dialy if not twice a day. It is allowing him to work and function   Lesion: staes that he messaged me and it has been there 7 months. States that it has not grown.  We have tried triamcinolone  paste that did not help. Of noted he has a rash to the right lower lateral posterior leg that has been present for a couple weeks. He is unsure if it is from the knee brace that he wears or his socks       Review of Systems  Constitutional:  Negative for chills and fever.  Respiratory:  Negative for shortness of breath.   Cardiovascular:  Negative for chest pain.  Neurological:  Negative for dizziness and headaches.      Objective:     BP 134/76   Pulse 68   Temp 97.9 F (36.6 C) (Oral)   Ht 5\' 5"  (1.651 m)   Wt 279 lb 9.6 oz (126.8 kg)   SpO2 95%   BMI 46.53 kg/m  BP Readings from Last 3 Encounters:  05/06/23 134/76  11/05/22 130/84  05/08/22 121/76   Wt Readings from Last 3 Encounters:  05/06/23 279 lb 9.6 oz (126.8 kg)  11/05/22 259 lb (117.5 kg)  05/08/22 268 lb 1.6 oz (121.6 kg)   SpO2 Readings from Last 3 Encounters:  05/06/23 95%  11/05/22 96%  05/02/22 98%      Physical Exam Vitals and nursing note reviewed.  Constitutional:      Appearance: Normal appearance.  Cardiovascular:     Rate and Rhythm: Normal rate and regular rhythm.     Heart sounds: Normal heart sounds.  Pulmonary:     Effort: Pulmonary effort is normal.     Breath sounds: Normal breath sounds.  Skin:      Neurological:     Mental Status: He is alert.      No results found for any visits on  05/06/23.    The ASCVD Risk score (Arnett DK, et al., 2019) failed to calculate for the following reasons:   The valid total cholesterol range is 130 to 320 mg/dL    Assessment & Plan:   Problem List Items Addressed This Visit       Cardiovascular and Mediastinum   Hypertension   Patient normally chlorthalidone  25 mg daily, amlodipine  10 mg daily, doxazosin  2 mg daily.  Patient is tolerating medications well and blood pressure well-controlled continue medication as prescribed        Digestive   Pyogenic granuloma of lip - Primary   Patient has granuloma to the left lower inside lip.  He will check with his dentist to see if they will remove it but we will do an ambulatory referral to dermatology for evaluation.      Relevant Orders   Ambulatory referral to Dermatology     Musculoskeletal and Integument   Primary osteoarthritis of left knee (Chronic)   Patient currently maintained on tramadol  50 mg twice daily.  UDS up-to-date.  PDMP reviewed.  Will continue medication as is offering value in patient's  life in regards to function of him being able to work.      Rash   Will try triamcinolone  0.1% cream twice daily for 7 to 10 days.  If no improvement he will follow-up with dermatology as referral was placed today.      Relevant Medications   triamcinolone  cream (KENALOG ) 0.1 %     Other   Chronic narcotic use   Currently maintained on tramadol  50 mg twice daily.  PDMP reviewed.  UDS up-to-date.  Medications prescribed       Return in about 6 months (around 11/05/2023) for CPE and Labs.    Margarie Shay, NP

## 2023-05-06 NOTE — Assessment & Plan Note (Signed)
 Currently maintained on tramadol  50 mg twice daily.  PDMP reviewed.  UDS up-to-date.  Medications prescribed

## 2023-05-06 NOTE — Assessment & Plan Note (Signed)
 Patient normally chlorthalidone  25 mg daily, amlodipine  10 mg daily, doxazosin  2 mg daily.  Patient is tolerating medications well and blood pressure well-controlled continue medication as prescribed

## 2023-05-06 NOTE — Patient Instructions (Signed)
 Nice to see you today I want to see you for your physical in 6 months, sooner if you need me I have referred you to the dermatologist You can check with your dentist and see if they will remove the lesion

## 2023-05-08 NOTE — Progress Notes (Unsigned)
 Scott York

## 2023-05-11 ENCOUNTER — Ambulatory Visit: Payer: Commercial Managed Care - PPO | Admitting: Adult Health

## 2023-05-11 ENCOUNTER — Encounter: Payer: Self-pay | Admitting: Adult Health

## 2023-05-11 VITALS — BP 136/81 | HR 78 | Ht 65.5 in | Wt 278.4 lb

## 2023-05-11 DIAGNOSIS — G4733 Obstructive sleep apnea (adult) (pediatric): Secondary | ICD-10-CM | POA: Diagnosis not present

## 2023-05-11 NOTE — Patient Instructions (Signed)
 Continue using CPAP nightly and greater than 4 hours each night If your symptoms worsen or you develop new symptoms please let us know.

## 2023-05-11 NOTE — Progress Notes (Signed)
 Discussed with Dr. Omar Bibber who recommended doing a home sleep test.  I will have my staff reach out to see if he is amendable then we can order him a new machine pending results

## 2023-05-11 NOTE — Progress Notes (Signed)
 PATIENT: Scott York DOB: 01/10/1965  REASON FOR VISIT: follow up HISTORY FROM: patient PRIMARY NEUROLOGIST: Dr. Omar Bibber  Chief Complaint  Patient presents with   Follow-up    Rm 20, alone.      HISTORY OF PRESENT ILLNESS: Today 05/11/23:  Scott York is a 58 y.o. male with a history of OSA on CPAP. Returns today for follow-up.  Reports that CPAP is working well.  He would like to replace his machine as he states that the water chamber is not fitting well.  His last sleep study was in 2020.  His download is below.       REVIEW OF SYSTEMS: Out of a complete 14 system review of symptoms, the patient complains only of the following symptoms, and all other reviewed systems are negative.  FSS ESS 2  ALLERGIES: Allergies  Allergen Reactions   Turmeric Anaphylaxis and Swelling    HOME MEDICATIONS: Outpatient Medications Prior to Visit  Medication Sig Dispense Refill   amLODipine  (NORVASC ) 10 MG tablet TAKE 1 TABLET BY MOUTH EVERY DAY 90 tablet 2   aspirin  EC 81 MG tablet Take 81 mg by mouth daily.     atorvastatin  (LIPITOR) 20 MG tablet Take 1 tablet (20 mg total) by mouth daily. 90 tablet 3   chlorthalidone  (HYGROTON ) 25 MG tablet Take 1 tablet (25 mg total) by mouth daily. 90 tablet 3   cyclobenzaprine  (FLEXERIL ) 10 MG tablet TAKE 1 TABLET BY MOUTH TWICE A DAY 180 tablet 1   doxazosin  (CARDURA ) 2 MG tablet Take 1 tablet (2 mg total) by mouth daily. 90 tablet 3   Multiple Vitamin (MULITIVITAMIN WITH MINERALS) TABS Take 1 tablet by mouth daily.     OMEGA 3 1200 MG CAPS Take 1,200 mg by mouth daily.      omeprazole (PRILOSEC) 40 MG capsule TAKE ONE CAPSULE ONCE DAILY, 30 MINUTES BEFORE EVENING MEALS.     ondansetron  (ZOFRAN -ODT) 4 MG disintegrating tablet Take by mouth.     sildenafil  (VIAGRA ) 100 MG tablet Take 1 tablet (100 mg total) by mouth as needed for erectile dysfunction. 30 tablet 0   silver sulfADIAZINE (SILVADENE) 1 % cream      traMADol  (ULTRAM )  50 MG tablet Take 1 tablet (50 mg total) by mouth 2 (two) times daily. 60 tablet 2   triamcinolone  (KENALOG ) 0.1 % paste Use as directed 1 Application in the mouth or throat 2 (two) times daily. 5 g 0   triamcinolone  cream (KENALOG ) 0.1 % Apply 1 Application topically 2 (two) times daily. 30 g 0   UNABLE TO FIND CPAP machine- At bedtime     No facility-administered medications prior to visit.    PAST MEDICAL HISTORY: Past Medical History:  Diagnosis Date   Cancer (HCC)    Cholelithiasis    DDD (degenerative disc disease), lumbar    L5-S1   Hyperlipidemia    Hypertension    Obesity    Sleep apnea    UTI (lower urinary tract infection)     PAST SURGICAL HISTORY: Past Surgical History:  Procedure Laterality Date   APPENDECTOMY  04/20/2011   Procedure: APPENDECTOMY;  Surgeon: Enid Harry, MD;  Location: [facility] OR;  Service: General;  Laterality: N/A;  Opened @1855 .   FRACTURE SURGERY Right    Clavicle   JOINT REPLACEMENT N/A    [employer] 07/04/2019   KNEE ARTHROSCOPY W/ MENISCAL REPAIR Left    NOSE SURGERY     TONSILLECTOMY     TOTAL KNEE  ARTHROPLASTY Left 07/16/2015   Procedure: TOTAL KNEE ARTHROPLASTY;  Surgeon: Neil Balls, MD;  Location: [facility] OR;  Service: Orthopedics;  Laterality: Left;   WISDOM TOOTH EXTRACTION      FAMILY HISTORY: Family History  Problem Relation Age of Onset   Heart attack Mother 15   Colon cancer Neg Hx    Colon polyps Neg Hx    Esophageal cancer Neg Hx    Rectal cancer Neg Hx    Stomach cancer Neg Hx     SOCIAL HISTORY: Social History   Socioeconomic History   Marital status: Married    Spouse name: Tammy   Number of children: 3   Years of education: Some college   Highest education level: Some college, no degree  Occupational History   Not on file  Tobacco Use   Smoking status: Former    Types: Cigars    Quit date: 04/30/2011    Years since quitting: 12.0   Smokeless tobacco: Never  Vaping Use   Vaping status: Never Used   Substance and Sexual Activity   Alcohol use: Not Currently   Drug use: No   Sexual activity: Yes  Other Topics Concern   Not on file  Social History Narrative   09/07/19   From: [place] , moved here to be near parents   Living: with Tammy, wife (1997) and daughter   Work: Hydrologist      Family: 3 children - phillip, joshua, angela (still in college) - 1 grandchild      Enjoys: video games, ankle and knee keep him sitting      Exercise: not currently   Diet: needs to work on diet      Safety   Seat belts: Yes    Guns: yes and secure   Safe in relationships: Yes    Social Drivers of Health   Financial Resource Strain: Low Risk  (04/28/2022)   Overall Financial Resource Strain (CARDIA)    Difficulty of Paying Living Expenses: Not hard at all  Food Insecurity: Low Risk  (06/19/2022)   Received from [facility]   Hunger Vital Sign    Worried About Running Out of Food in the Last Year: Never true    Ran Out of Food in the Last Year: Never true  Transportation Needs: Not on file (06/19/2022)  Physical Activity: Unknown (04/28/2022)   Exercise Vital Sign    Days of Exercise per Week: 0 days    Minutes of Exercise per Session: Not on file  Stress: No Stress Concern Present (04/28/2022)   [employer] of Occupational Health - Occupational Stress Questionnaire    Feeling of Stress : Not at all  Social Connections: Moderately Isolated (04/28/2022)   Social Connection and Isolation Panel [NHANES]    Frequency of Communication with Friends and Family: More than three times a week    Frequency of Social Gatherings with Friends and Family: Once a week    Attends Religious Services: Never    Database administrator or Organizations: No    Attends Engineer, structural: Not on file    Marital Status: Married  Intimate Partner Violence: Unknown (04/11/2021)   Received from [employer]   HITS    Physically Hurt: Not on file    Insult or Talk Down To: Not  on file    Threaten Physical Harm: Not on file    Scream or Curse: Not on file      PHYSICAL EXAM  Vitals:  05/11/23 0832 05/11/23 0843  BP: (!) 156/81 136/81  Pulse: 71 78  Weight: 278 lb 6.4 oz (126.3 kg)   Height: 5' 5.5" (1.664 m)    Body mass index is 45.62 kg/m.  Generalized: Well developed, in no acute distress  Chest: Lungs clear to auscultation bilaterally  Neurological examination  Mentation: Alert oriented to time, place, history taking. Follows all commands speech and language fluent Cranial nerve II-XII:  facial symmetry noted   DIAGNOSTIC DATA (LABS, IMAGING, TESTING) - I reviewed patient records, labs, notes, testing and imaging myself where available.  Lab Results  Component Value Date   WBC 6.9 11/05/2022   HGB 14.6 11/05/2022   HCT 44.9 11/05/2022   MCV 88.4 11/05/2022   PLT 248.0 11/05/2022      Component Value Date/Time   NA 141 11/05/2022 0824   K 3.8 11/05/2022 0824   CL 102 11/05/2022 0824   CO2 31 11/05/2022 0824   GLUCOSE 88 11/05/2022 0824   BUN 16 11/05/2022 0824   CREATININE 0.82 11/05/2022 0824   CALCIUM  9.8 11/05/2022 0824   PROT 7.0 11/05/2022 0824   ALBUMIN 4.1 11/05/2022 0824   AST 23 11/05/2022 0824   ALT 27 11/05/2022 0824   ALKPHOS 109 11/05/2022 0824   BILITOT 0.6 11/05/2022 0824   GFRNONAA >60 07/17/2015 0825   GFRAA >60 07/17/2015 0825   Lab Results  Component Value Date   CHOL 125 11/05/2022   HDL 54.10 11/05/2022   LDLCALC 56 11/05/2022   TRIG 77.0 11/05/2022   CHOLHDL 2 11/05/2022   Lab Results  Component Value Date   HGBA1C 5.7 11/05/2022   No results found for: "VITAMINB12" Lab Results  Component Value Date   TSH 2.25 11/05/2022      ASSESSMENT AND PLAN 58 y.o. year old male  has a past medical history of Cancer (HCC), Cholelithiasis, DDD (degenerative disc disease), lumbar, Hyperlipidemia, Hypertension, Obesity, Sleep apnea, and UTI (lower urinary tract infection). here with:  OSA on  CPAP  - CPAP compliance excellent - Good treatment of AHI  - Encourage patient to use CPAP nightly and > 4 hours each night -Patient would like to replace this machine we discussed repeating HST patient states that he would rather not do this if possible.  States that he does not want to sleep without the CPAP.  Advised that I would discuss with Dr. Omar Bibber - F/U after getting new CPAP machine    Clem Currier, MSN, NP-C 05/11/2023, 8:24 AM [facility] [address], [address] [place], [place] [address] [phone]

## 2023-05-12 ENCOUNTER — Telehealth: Payer: Self-pay | Admitting: *Deleted

## 2023-05-12 DIAGNOSIS — G4733 Obstructive sleep apnea (adult) (pediatric): Secondary | ICD-10-CM

## 2023-05-12 NOTE — Addendum Note (Signed)
 Addended by: Burns Carwin on: 05/12/2023 12:17 PM   Modules accepted: Orders

## 2023-05-12 NOTE — Telephone Encounter (Signed)
 Per message received from Megan NP, Dr Omar Bibber does recommend HST before ordering new machine. Patient does have the option to do HST for at least 4 hours without PAP, then he can take the sensors off and take a nap with the machine.

## 2023-05-22 ENCOUNTER — Ambulatory Visit (INDEPENDENT_AMBULATORY_CARE_PROVIDER_SITE_OTHER): Admitting: Neurology

## 2023-05-22 DIAGNOSIS — G4733 Obstructive sleep apnea (adult) (pediatric): Secondary | ICD-10-CM

## 2023-06-04 NOTE — Progress Notes (Signed)
 See procedure note.

## 2023-06-09 NOTE — Procedures (Signed)
 [facility]  HOME SLEEP TEST (SANSA) REPORT (Mail-Out Device):   STUDY DATE: 05/24/2023  DOB: 26-Apr-1965  MRN: [id number]  ORDERING CLINICIAN: Debbra Fairy, MD, PhD   REFERRING CLINICIAN: Clem Currier, NP  CLINICAL INFORMATION/HISTORY: 58 year old right-handed gentleman with an underlying medical history of hypertension, hyperlipidemia, cholelithiasis, lumbar degenerative disc disease, status post left total knee replacement, status post tonsillectomy, history of UTI and morbid obesity with a BMI of over 40, who presents for re-evaluation of his OSA. He has been compliant with his autoPAP of 7-13 cm with EPR of 3 with good tolerance and adequate apnea control. He should be eligible for a new machine.  BMI (at the time of sleep clinic visit and/or test date): 45.6 kg/m  FINDINGS:   Study Protocol:    The SANSA single-point-of-skin-contact chest-worn sensor - an FDA cleared and DOT approved type 4 home sleep test device - measures eight physiological channels,  including blood oxygen saturation (measured via PPG [photoplethysmography]), EKG-derived heart rate, respiratory effort, chest movement (measured via accelerometer), snoring, body position, and actigraphy. The device is designed to be worn for up to 10 hours per study.   Sleep Summary:   Total Recording Time (hours, min): 7 hours, 36 min  Total Effective Sleep Time (hours, min):  5 hours, 17 min  Sleep Efficiency (%):    70%   Respiratory Indices:   Calculated sAHI (per hour):  21.1/hour         Oxygen Saturation Statistics:    Oxygen Saturation (%) Mean: 92.8%   Minimum oxygen saturation (%):                 75.2%   O2 Saturation Range (%): 75.2-98.3%   Time below or at 88% saturation: 5 min   Pulse Rate Statistics:   Pulse Mean (bpm):    67/min    Pulse Range (53- 118/min)   Snoring: Intermittent, mild to moderate  IMPRESSION/DIAGNOSES:   OSA (obstructive sleep apnea),  moderate   RECOMMENDATIONS:   This home sleep test demonstrates moderate obstructive sleep apnea with a total AHI of 21.1/hour and O2 nadir of 75.2%.  Intermittent mild to moderate snoring was detected.  Ongoing treatment with a positive airway pressure (PAP) device is recommended. The patient has been compliant with his autoPAP of 7-13 cm with EPR of 3 with good tolerance and adequate apnea control. He should be eligible for a new machine.I recommend keeping the settings the same, mask of choice, sized to fit. A full night titration study may be considered to optimize treatment settings, monitor proper oxygen saturations and aid with improvement of tolerance and adherence, if needed down the road. Alternative treatment options may include a dental device through dentistry or orthodontics in selected patients or Inspire (hypoglossal nerve stimulator) in carefully selected patients (meeting inclusion criteria).  Concomitant weight loss is recommended (where clinically appropriate). Please note that untreated obstructive sleep apnea may carry additional perioperative morbidity. Patients with significant obstructive sleep apnea should receive perioperative PAP therapy and the surgeons and particularly the anesthesiologist should be informed of the diagnosis and the severity of the sleep disordered breathing. The patient should be cautioned not to drive, work at heights, or operate dangerous or heavy equipment when tired or sleepy. Review and reiteration of good sleep hygiene measures should be pursued with any patient. Other causes of the patient's symptoms, including circadian rhythm disturbances, an underlying mood disorder, medication effect and/or an underlying medical problem cannot be ruled out based on this  test. Clinical correlation is recommended.  The patient and his referring provider will be notified of the test results. The patient will be seen in follow up in sleep clinic at [facility].  I certify  that I have reviewed the raw data recording prior to the issuance of this report in accordance with the standards of the American Academy of Sleep Medicine (AASM).    INTERPRETING PHYSICIAN:   Debbra Fairy, MD, PhD Medical Director, [facility]) Diplomat, ABPN (Neurology and Sleep)   [facility] [address] [place], [place] [address] [phone]

## 2023-06-10 ENCOUNTER — Ambulatory Visit: Payer: Self-pay | Admitting: Adult Health

## 2023-06-10 DIAGNOSIS — G4733 Obstructive sleep apnea (adult) (pediatric): Secondary | ICD-10-CM

## 2023-06-10 NOTE — Telephone Encounter (Signed)
 New, Maryella Shivers, Otilio Jefferson, RN; Alain Honey; Jeris Penta, [place]; 1 other Received, thank you!

## 2023-06-10 NOTE — Telephone Encounter (Signed)
Order sent to [facility]  

## 2023-06-12 ENCOUNTER — Other Ambulatory Visit: Payer: Self-pay | Admitting: Nurse Practitioner

## 2023-06-12 DIAGNOSIS — M1712 Unilateral primary osteoarthritis, left knee: Secondary | ICD-10-CM

## 2023-07-15 ENCOUNTER — Encounter: Payer: Self-pay | Admitting: Nurse Practitioner

## 2023-08-16 ENCOUNTER — Encounter: Payer: Self-pay | Admitting: Nurse Practitioner

## 2023-08-17 NOTE — Telephone Encounter (Signed)
 Needs office visit for edema

## 2023-08-18 NOTE — Telephone Encounter (Signed)
 Sch ov for Fri, 8/15

## 2023-08-21 ENCOUNTER — Ambulatory Visit: Payer: Self-pay | Admitting: Family

## 2023-08-21 ENCOUNTER — Other Ambulatory Visit: Payer: Self-pay | Admitting: Family

## 2023-08-21 ENCOUNTER — Ambulatory Visit: Admitting: Nurse Practitioner

## 2023-08-21 VITALS — BP 132/80 | HR 64 | Temp 97.7°F | Ht 65.5 in | Wt 282.8 lb

## 2023-08-21 DIAGNOSIS — R600 Localized edema: Secondary | ICD-10-CM | POA: Diagnosis not present

## 2023-08-21 DIAGNOSIS — E876 Hypokalemia: Secondary | ICD-10-CM

## 2023-08-21 DIAGNOSIS — I10 Essential (primary) hypertension: Secondary | ICD-10-CM

## 2023-08-21 LAB — CBC
HCT: 42.8 % (ref 39.0–52.0)
Hemoglobin: 14.2 g/dL (ref 13.0–17.0)
MCHC: 33.3 g/dL (ref 30.0–36.0)
MCV: 86.9 fl (ref 78.0–100.0)
Platelets: 244 K/uL (ref 150.0–400.0)
RBC: 4.92 Mil/uL (ref 4.22–5.81)
RDW: 13.9 % (ref 11.5–15.5)
WBC: 5.7 K/uL (ref 4.0–10.5)

## 2023-08-21 LAB — COMPREHENSIVE METABOLIC PANEL WITH GFR
ALT: 28 U/L (ref 0–53)
AST: 25 U/L (ref 0–37)
Albumin: 4.2 g/dL (ref 3.5–5.2)
Alkaline Phosphatase: 82 U/L (ref 39–117)
BUN: 14 mg/dL (ref 6–23)
CO2: 31 meq/L (ref 19–32)
Calcium: 9.4 mg/dL (ref 8.4–10.5)
Chloride: 101 meq/L (ref 96–112)
Creatinine, Ser: 0.82 mg/dL (ref 0.40–1.50)
GFR: 96.67 mL/min (ref >60.00)
Glucose, Bld: 90 mg/dL (ref 70–99)
Potassium: 3.3 meq/L — ABNORMAL LOW (ref 3.5–5.1)
Sodium: 141 meq/L (ref 135–145)
Total Bilirubin: 0.5 mg/dL (ref 0.2–1.2)
Total Protein: 7.3 g/dL (ref 6.0–8.3)

## 2023-08-21 LAB — BRAIN NATRIURETIC PEPTIDE: Pro B Natriuretic peptide (BNP): 16 pg/mL (ref 0.0–100.0)

## 2023-08-21 MED ORDER — POTASSIUM CHLORIDE CRYS ER 20 MEQ PO TBCR: 20.0000 meq | EXTENDED_RELEASE_TABLET | Freq: Every day | ORAL | 3 refills | Status: DC

## 2023-08-21 NOTE — Progress Notes (Signed)
 Acute Office Visit  Subjective:     Patient ID: Scott York, male    DOB: Jun 06, 1965, 58 y.o.   MRN: [id number]  Chief Complaint  Patient presents with   Edema    HPI   Discussed the use of AI scribe software for clinical note transcription with the patient, who gave verbal consent to proceed.  History of Present Illness Scott York is a 58 year old male with hypertension who presents with bilateral leg swelling.  He noticed swelling in both legs starting on Sunday, which decreases slightly with leg elevation or rest but worsens by the end of the day, especially after wearing boots, causing a 'muffin' effect above the sock line. No shortness of breath, chest pain, numbness, or tingling associated with the swelling.  He has a history of similar swelling episodes in the past, managed with chlorthalidone  25 mg. He is also on doxazosin  2 mg for blood pressure management. He is currently taking amlodipine  for hypertension. He mentions a previous CT scan showing left ventricular enlargement. He recalls being on Bystolic  and lisinopril in the past for blood pressure management but does not remember the reasons for discontinuation.  He reports a recent weight gain of 11 pounds and questions if this is due to fluid retention. No swelling in the abdomen or arms and no difficulty breathing when lying flat. He mentions a transient tingling sensation in his back, attributed to positional changes.  Socially, he notes a change in his work routine, now moving around more and not operating heavy equipment as much. He observes that the swelling worsens by the end of the day    Review of Systems  Constitutional:  Negative for chills and fever.  Respiratory:  Negative for shortness of breath.   Cardiovascular:  Positive for leg swelling. Negative for chest pain.  Neurological:  Negative for tingling and weakness.        Objective:    BP 132/80   Pulse 64   Temp 97.7 F (36.5 C)  (Oral)   Ht 5' 5.5 (1.664 m)   Wt 282 lb 12.8 oz (128.3 kg)   SpO2 98%   BMI 46.34 kg/m  BP Readings from Last 3 Encounters:  08/21/23 132/80  05/11/23 136/81  05/06/23 134/76   Wt Readings from Last 3 Encounters:  08/21/23 282 lb 12.8 oz (128.3 kg)  05/11/23 278 lb 6.4 oz (126.3 kg)  05/06/23 279 lb 9.6 oz (126.8 kg)   SpO2 Readings from Last 3 Encounters:  08/21/23 98%  05/06/23 95%  11/05/22 96%      Physical Exam Vitals and nursing note reviewed.  Constitutional:      Appearance: Normal appearance.  Cardiovascular:     Rate and Rhythm: Normal rate and regular rhythm.     Heart sounds: Normal heart sounds.  Pulmonary:     Effort: Pulmonary effort is normal.     Breath sounds: Normal breath sounds.  Musculoskeletal:     Right lower leg: 2+ Edema present.     Left lower leg: 2+ Edema present.  Neurological:     Mental Status: He is alert.     No results found for any visits on 08/21/23.      Assessment & Plan:   Problem List Items Addressed This Visit       Cardiovascular and Mediastinum   Hypertension - Primary   Relevant Orders   CBC   Comprehensive metabolic panel with GFR   Other Visit Diagnoses  Lower extremity edema       Relevant Orders   CBC   Comprehensive metabolic panel with GFR   Brain natriuretic peptide     Assessment and Plan Assessment & Plan Bilateral lower extremity edema Edema present since Sunday, possibly due to amlodipine . Differential includes cardiac fluid retention, less likely due to symptom improvement with leg elevation.  Weight gain suggests fluid retention. Risk of kidney injury and hypokalemia with additional diuretics. - Order labs to assess cardiac function and fluid status. - Review labs to determine if edema is cardiac-related or due to amlodipine . - Consider reducing amlodipine  from 10 mg to 5 mg if non-cardiac cause. - Evaluate alternative antihypertensives if amlodipine  reduction is necessary. -  consider valsartan or olmesartan   Hypertension Hypertension managed with amlodipine , chlorthalidone , and doxazosin . Amlodipine  may cause edema. Previous medications include Bystolic  and lisinopril. Current blood pressure control appears adequate, but adjustments may be needed based on lab results and edema management. - Monitor blood pressure at home. - Reassess antihypertensive regimen based on lab results and edema management. - Consider alternative antihypertensives if amlodipine  is reduced.   No orders of the defined types were placed in this encounter.   Return if symptoms worsen or fail to improve, for As scheduled .  Adina Crandall, NP

## 2023-08-21 NOTE — Patient Instructions (Signed)
 Nice to see you today I will be in touch with the labs once I have them  Elevated your legs when you are able

## 2023-08-25 ENCOUNTER — Other Ambulatory Visit: Payer: Self-pay | Admitting: Nurse Practitioner

## 2023-08-25 ENCOUNTER — Encounter: Payer: Self-pay | Admitting: Nurse Practitioner

## 2023-08-25 DIAGNOSIS — M1712 Unilateral primary osteoarthritis, left knee: Secondary | ICD-10-CM

## 2023-08-25 DIAGNOSIS — N529 Male erectile dysfunction, unspecified: Secondary | ICD-10-CM

## 2023-08-25 MED ORDER — CYCLOBENZAPRINE HCL 10 MG PO TABS: 10.0000 mg | ORAL_TABLET | Freq: Two times a day (BID) | ORAL | 0 refills | Status: DC

## 2023-08-25 MED ORDER — TRAMADOL HCL 50 MG PO TABS: 50.0000 mg | ORAL_TABLET | Freq: Two times a day (BID) | ORAL | 0 refills | Status: DC

## 2023-08-25 MED ORDER — SILDENAFIL CITRATE 100 MG PO TABS: 100.0000 mg | ORAL_TABLET | ORAL | 0 refills | Status: DC | PRN

## 2023-09-21 ENCOUNTER — Other Ambulatory Visit: Payer: Self-pay | Admitting: Nurse Practitioner

## 2023-09-21 DIAGNOSIS — N529 Male erectile dysfunction, unspecified: Secondary | ICD-10-CM

## 2023-10-06 ENCOUNTER — Other Ambulatory Visit: Payer: Self-pay | Admitting: Nurse Practitioner

## 2023-10-06 DIAGNOSIS — E782 Mixed hyperlipidemia: Secondary | ICD-10-CM

## 2023-10-06 DIAGNOSIS — R21 Rash and other nonspecific skin eruption: Secondary | ICD-10-CM

## 2023-10-06 DIAGNOSIS — I10 Essential (primary) hypertension: Secondary | ICD-10-CM

## 2023-11-06 ENCOUNTER — Ambulatory Visit: Admitting: Nurse Practitioner

## 2023-11-06 ENCOUNTER — Encounter: Payer: Self-pay | Admitting: Nurse Practitioner

## 2023-11-06 VITALS — BP 134/80 | HR 72 | Temp 98.0°F | Ht 65.0 in | Wt 272.0 lb

## 2023-11-06 DIAGNOSIS — E785 Hyperlipidemia, unspecified: Secondary | ICD-10-CM | POA: Diagnosis not present

## 2023-11-06 DIAGNOSIS — Z125 Encounter for screening for malignant neoplasm of prostate: Secondary | ICD-10-CM | POA: Diagnosis not present

## 2023-11-06 DIAGNOSIS — N529 Male erectile dysfunction, unspecified: Secondary | ICD-10-CM

## 2023-11-06 DIAGNOSIS — I10 Essential (primary) hypertension: Secondary | ICD-10-CM | POA: Diagnosis not present

## 2023-11-06 DIAGNOSIS — Z0000 Encounter for general adult medical examination without abnormal findings: Secondary | ICD-10-CM

## 2023-11-06 DIAGNOSIS — Z87891 Personal history of nicotine dependence: Secondary | ICD-10-CM | POA: Diagnosis not present

## 2023-11-06 DIAGNOSIS — C499 Malignant neoplasm of connective and soft tissue, unspecified: Secondary | ICD-10-CM

## 2023-11-06 DIAGNOSIS — G4733 Obstructive sleep apnea (adult) (pediatric): Secondary | ICD-10-CM

## 2023-11-06 DIAGNOSIS — R7303 Prediabetes: Secondary | ICD-10-CM | POA: Diagnosis not present

## 2023-11-06 DIAGNOSIS — F1190 Opioid use, unspecified, uncomplicated: Secondary | ICD-10-CM

## 2023-11-06 LAB — CBC WITH DIFFERENTIAL/PLATELET
Basophils Absolute: 0 K/uL (ref 0.0–0.1)
Basophils Relative: 0.5 % (ref 0.0–3.0)
Eosinophils Absolute: 0.1 K/uL (ref 0.0–0.7)
Eosinophils Relative: 1.8 % (ref 0.0–5.0)
HCT: 42 % (ref 39.0–52.0)
Hemoglobin: 14.5 g/dL (ref 13.0–17.0)
Lymphocytes Relative: 17 % (ref 12.0–46.0)
Lymphs Abs: 0.9 K/uL (ref 0.7–4.0)
MCHC: 34.4 g/dL (ref 30.0–36.0)
MCV: 86 fl (ref 78.0–100.0)
Monocytes Absolute: 0.6 K/uL (ref 0.1–1.0)
Monocytes Relative: 10.3 % (ref 3.0–12.0)
Neutro Abs: 3.9 K/uL (ref 1.4–7.7)
Neutrophils Relative %: 70.4 % (ref 43.0–77.0)
Platelets: 229 K/uL (ref 150.0–400.0)
RBC: 4.89 Mil/uL (ref 4.22–5.81)
RDW: 13.5 % (ref 11.5–15.5)
WBC: 5.5 K/uL (ref 4.0–10.5)

## 2023-11-06 LAB — LIPID PANEL
Cholesterol: 119 mg/dL (ref 0–200)
HDL: 52.3 mg/dL (ref >39.00)
LDL Cholesterol: 55 mg/dL (ref 0–99)
NonHDL: 66.84
Total CHOL/HDL Ratio: 2
Triglycerides: 57 mg/dL (ref 0.0–149.0)
VLDL: 11.4 mg/dL (ref 0.0–40.0)

## 2023-11-06 LAB — URINALYSIS, MICROSCOPIC ONLY

## 2023-11-06 LAB — COMPREHENSIVE METABOLIC PANEL WITH GFR
ALT: 25 U/L (ref 0–53)
AST: 25 U/L (ref 0–37)
Albumin: 4.1 g/dL (ref 3.5–5.2)
Alkaline Phosphatase: 90 U/L (ref 39–117)
BUN: 13 mg/dL (ref 6–23)
CO2: 31 meq/L (ref 19–32)
Calcium: 9.6 mg/dL (ref 8.4–10.5)
Chloride: 101 meq/L (ref 96–112)
Creatinine, Ser: 0.83 mg/dL (ref 0.40–1.50)
GFR: 96.17 mL/min (ref >60.00)
Glucose, Bld: 86 mg/dL (ref 70–99)
Potassium: 3.5 meq/L (ref 3.5–5.1)
Sodium: 141 meq/L (ref 135–145)
Total Bilirubin: 0.7 mg/dL (ref 0.2–1.2)
Total Protein: 6.8 g/dL (ref 6.0–8.3)

## 2023-11-06 LAB — PSA: PSA: 0.56 ng/mL (ref 0.10–4.00)

## 2023-11-06 LAB — TSH: TSH: 1.35 u[IU]/mL (ref 0.35–5.50)

## 2023-11-06 LAB — HEMOGLOBIN A1C: Hgb A1c MFr Bld: 5.8 % (ref 4.6–6.5)

## 2023-11-06 NOTE — Assessment & Plan Note (Signed)
 History of the same.  Patient lost approximately 10 pounds.  Pending TSH, A1c, lipid panel today.

## 2023-11-06 NOTE — Assessment & Plan Note (Signed)
 History of the same.  Patient is followed through [facility].  Most recent visit June 2025 with CT scan of chest abdomen pelvis.  Continue following with specialist as recommended

## 2023-11-06 NOTE — Assessment & Plan Note (Signed)
 Patient currently maintained on amlodipine  10 mg daily, chlorthalidone  25 mg daily, and doxazosin  2 mg daily.  Blood pressure elevated upon initial check but within normal limits upon recheck.  Continue checking blood pressure at home to take medication as prescribed

## 2023-11-06 NOTE — Progress Notes (Signed)
 Established Patient Office Visit  Subjective   Patient ID: Scott York, male    DOB: July 24, 1965  Age: 58 y.o. MRN: [id number]  Chief Complaint  Patient presents with   Annual Exam    HPI  OSA: he is getting 6-7 hours of sleep and he feels rested   HTN: Patient currently maintained on amlodipine  10 mg daily, chlorthalidone  25 mg daily, doxazosin  2 mg daily.  Patient is also on potassium replacement due to chlorthalidone . He check it at home several times a week and it was good   OA: Patient currently maintained on tramadol  50 mg twice daily as needed.  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  Last office visit was 06/09/2023 at that juncture he did have a CT chest abdomen pelvis with contrast.  Showed no new or progressive disease  for complete physical and follow up of chronic conditions.  Immunizations: -Tetanus: Completed in 2023 -Influenza: refused  -Shingles: refused -Pneumonia:refused   Diet: Fair diet. He is eating 2 meals a day and some snacks. He has coffee and water  Exercise: No regular exercise.  Eye exam: Completes annually. glasses  Dental exam: PRN   Colonoscopy: Completed in 12/09/2021, recall 7 years.  Patient due 2030 Lung Cancer Screening: N/A  PSA: Due        Review of Systems  Constitutional:  Negative for chills and fever.  Respiratory:  Negative for shortness of breath.   Cardiovascular:  Negative for chest pain and leg swelling.  Gastrointestinal:  Negative for abdominal pain, blood in stool, constipation, diarrhea, nausea and vomiting.       BM Daily   Genitourinary:  Negative for dysuria and hematuria.  Neurological:  Negative for dizziness, tingling and headaches.  Psychiatric/Behavioral:  Negative for hallucinations and suicidal ideas.       Objective:     BP 134/80   Pulse 72   Temp 98 F (36.7  C) (Oral)   Ht 5' 5 (1.651 m)   Wt 272 lb (123.4 kg)   SpO2 94%   BMI 45.26 kg/m  BP Readings from Last 3 Encounters:  11/06/23 134/80  08/21/23 132/80  05/11/23 136/81   Wt Readings from Last 3 Encounters:  11/06/23 272 lb (123.4 kg)  08/21/23 282 lb 12.8 oz (128.3 kg)  05/11/23 278 lb 6.4 oz (126.3 kg)   SpO2 Readings from Last 3 Encounters:  11/06/23 94%  08/21/23 98%  05/06/23 95%      Physical Exam Vitals and nursing note reviewed.  Constitutional:      Appearance: Normal appearance. He is obese.  HENT:     Right Ear: Tympanic membrane, ear canal and external ear normal.     Left Ear: Tympanic membrane, ear canal and external ear normal.     Mouth/Throat:     Mouth: Mucous membranes are moist.     Pharynx: Oropharynx is clear.  Eyes:     Extraocular Movements: Extraocular movements intact.     Pupils: Pupils are equal, round, and reactive to light.  Cardiovascular:     Rate and Rhythm: Normal rate and regular rhythm.     Pulses: Normal pulses.     Heart sounds: Normal heart sounds.  Pulmonary:     Effort: Pulmonary effort is normal.     Breath sounds: Normal breath sounds.  Abdominal:     General: Bowel sounds are normal.  There is no distension.     Palpations: There is no mass.     Tenderness: There is no abdominal tenderness.     Hernia: No hernia is present.  Musculoskeletal:     Right lower leg: No edema.     Left lower leg: No edema.  Lymphadenopathy:     Cervical: No cervical adenopathy.  Skin:    General: Skin is warm.  Neurological:     General: No focal deficit present.     Mental Status: He is alert.     Deep Tendon Reflexes:     Reflex Scores:      Bicep reflexes are 2+ on the right side and 2+ on the left side.      Patellar reflexes are 2+ on the right side and 2+ on the left side.    Comments: Bilateral upper and lower extremity strength 5/5  Psychiatric:        Mood and Affect: Mood normal.        Behavior: Behavior normal.         Thought Content: Thought content normal.        Judgment: Judgment normal.      No results found for any visits on 11/06/23.    The ASCVD Risk score (Arnett DK, et al., 2019) failed to calculate for the following reasons:   The valid total cholesterol range is 130 to 320 mg/dL    Assessment & Plan:   Problem List Items Addressed This Visit       Cardiovascular and Mediastinum   Hypertension   Patient currently maintained on amlodipine  10 mg daily, chlorthalidone  25 mg daily, and doxazosin  2 mg daily.  Blood pressure elevated upon initial check but within normal limits upon recheck.  Continue checking blood pressure at home to take medication as prescribed      Relevant Orders   CBC with Differential/Platelet   Comprehensive metabolic panel with GFR   TSH     Respiratory   OSA on CPAP   Patient is adherent to CPAP therapy with good result.  Continue        Other   Hyperlipidemia   Patient currently maintained on atorvastatin  20 mg daily.  Pending lipid panel today      Relevant Orders   Lipid panel   Erectile dysfunction   Patient currently maintained on sildenafil  as needed.  Continue      Morbid obesity (HCC)   History of the same.  Patient lost approximately 10 pounds.  Pending TSH, A1c, lipid panel today.      Chronic narcotic use   Current maintained on tramadol  50 mg twice daily.  PDMP reviewed.  We will update urine drug screen today.  This allows patient to continue working on function continue medication as prescribed      Relevant Orders   DRUG MONITORING, PANEL 8 WITH CONFIRMATION, URINE   Prediabetes   History of same pending A1c.  Patient has lost weight since last office visit approximately 10 pounds      Relevant Orders   Hemoglobin A1c   Myxoid liposarcoma (HCC)   History of the same.  Patient is followed through [facility].  Most recent visit June 2025 with CT scan of chest abdomen pelvis.  Continue following with specialist as  recommended      Preventative health care - Primary   Discussed age-appropriate immunizations and screening exams.  Did review patient's personal, surgical, social, family histories.  Patient is up-to-date on  all age-appropriate vaccinations he would like.  Patient declined flu, pneumonia, shingles vaccine.  Patient up-to-date on CRC screening.  PSA ordered for prostate cancer screening today.  Patient was given information at discharge about preventative healthcare maintenance with anticipatory guidance.      Relevant Orders   CBC with Differential/Platelet   Comprehensive metabolic panel with GFR   TSH   Former tobacco use   History of cigar use.  Pending urine microscopy rule out microscopic hematuria      Relevant Orders   Urine Microscopic   Other Visit Diagnoses       Screening for prostate cancer       Relevant Orders   PSA       Return in about 6 months (around 05/05/2024) for med recheck (pain).    Adina Crandall, NP

## 2023-11-06 NOTE — Patient Instructions (Signed)
Nice to see you today I will be in touch with the labs once I have them Follow up with me in 6 months, sooner if you need me 

## 2023-11-06 NOTE — Assessment & Plan Note (Signed)
 Discussed age-appropriate immunizations and screening exams.  Did review patient's personal, surgical, social, family histories.  Patient is up-to-date on all age-appropriate vaccinations he would like.  Patient declined flu, pneumonia, shingles vaccine.  Patient up-to-date on CRC screening.  PSA ordered for prostate cancer screening today.  Patient was given information at discharge about preventative healthcare maintenance with anticipatory guidance.

## 2023-11-06 NOTE — Assessment & Plan Note (Signed)
 Current maintained on tramadol  50 mg twice daily.  PDMP reviewed.  We will update urine drug screen today.  This allows patient to continue working on function continue medication as prescribed

## 2023-11-06 NOTE — Assessment & Plan Note (Signed)
 History of same pending A1c.  Patient has lost weight since last office visit approximately 10 pounds

## 2023-11-06 NOTE — Assessment & Plan Note (Signed)
 History of cigar use.  Pending urine microscopy rule out microscopic hematuria

## 2023-11-06 NOTE — Assessment & Plan Note (Signed)
 Patient is adherent to CPAP therapy with good result.  Continue

## 2023-11-06 NOTE — Assessment & Plan Note (Signed)
 Patient currently maintained on atorvastatin 20 mg daily.  Pending lipid panel today

## 2023-11-06 NOTE — Assessment & Plan Note (Signed)
 Patient currently maintained on sildenafil  as needed.  Continue

## 2023-11-07 LAB — DRUG MONITORING, PANEL 8 WITH CONFIRMATION, URINE
6 Acetylmorphine: NEGATIVE ng/mL (ref <10)
Alcohol Metabolites: NEGATIVE ng/mL (ref <500)
Amphetamines: NEGATIVE ng/mL (ref <500)
Benzodiazepines: NEGATIVE ng/mL (ref <100)
Buprenorphine, Urine: NEGATIVE ng/mL (ref <5)
Cocaine Metabolite: NEGATIVE ng/mL (ref <150)
Creatinine: 69.4 mg/dL (ref >=20.0)
MDMA: NEGATIVE ng/mL (ref <500)
Marijuana Metabolite: NEGATIVE ng/mL (ref <20)
Opiates: NEGATIVE ng/mL (ref <100)
Oxidant: NEGATIVE ug/mL (ref <200)
Oxycodone: NEGATIVE ng/mL (ref <100)
pH: 7.4 (ref 4.5–9.0)

## 2023-11-07 LAB — DM TEMPLATE

## 2023-11-10 ENCOUNTER — Other Ambulatory Visit: Payer: Self-pay | Admitting: Family

## 2023-11-10 DIAGNOSIS — M1712 Unilateral primary osteoarthritis, left knee: Secondary | ICD-10-CM

## 2023-11-11 ENCOUNTER — Ambulatory Visit: Payer: Self-pay | Admitting: Nurse Practitioner

## 2023-11-12 ENCOUNTER — Encounter: Payer: Self-pay | Admitting: Nurse Practitioner

## 2023-11-12 DIAGNOSIS — M1712 Unilateral primary osteoarthritis, left knee: Secondary | ICD-10-CM

## 2023-11-13 ENCOUNTER — Other Ambulatory Visit: Payer: Self-pay | Admitting: Nurse Practitioner

## 2023-11-13 DIAGNOSIS — R21 Rash and other nonspecific skin eruption: Secondary | ICD-10-CM

## 2023-11-13 MED ORDER — TRAMADOL HCL 50 MG PO TABS
50.0000 mg | ORAL_TABLET | Freq: Two times a day (BID) | ORAL | 2 refills | Status: AC
Start: 2023-11-13 — End: ?

## 2023-12-12 ENCOUNTER — Other Ambulatory Visit: Payer: Self-pay | Admitting: Nurse Practitioner

## 2023-12-12 DIAGNOSIS — M1712 Unilateral primary osteoarthritis, left knee: Secondary | ICD-10-CM

## 2023-12-13 ENCOUNTER — Other Ambulatory Visit: Payer: Self-pay | Admitting: Family

## 2023-12-13 DIAGNOSIS — M1712 Unilateral primary osteoarthritis, left knee: Secondary | ICD-10-CM

## 2024-01-10 IMAGING — DX DG SHOULDER 2+V*L*
3 series · 3 of 3 positions shown · non-contrast
Comparison: None.

CLINICAL DATA: Left shoulder pain after fall.

EXAM:
LEFT SHOULDER - 2+ VIEW

[shoulder grashey]
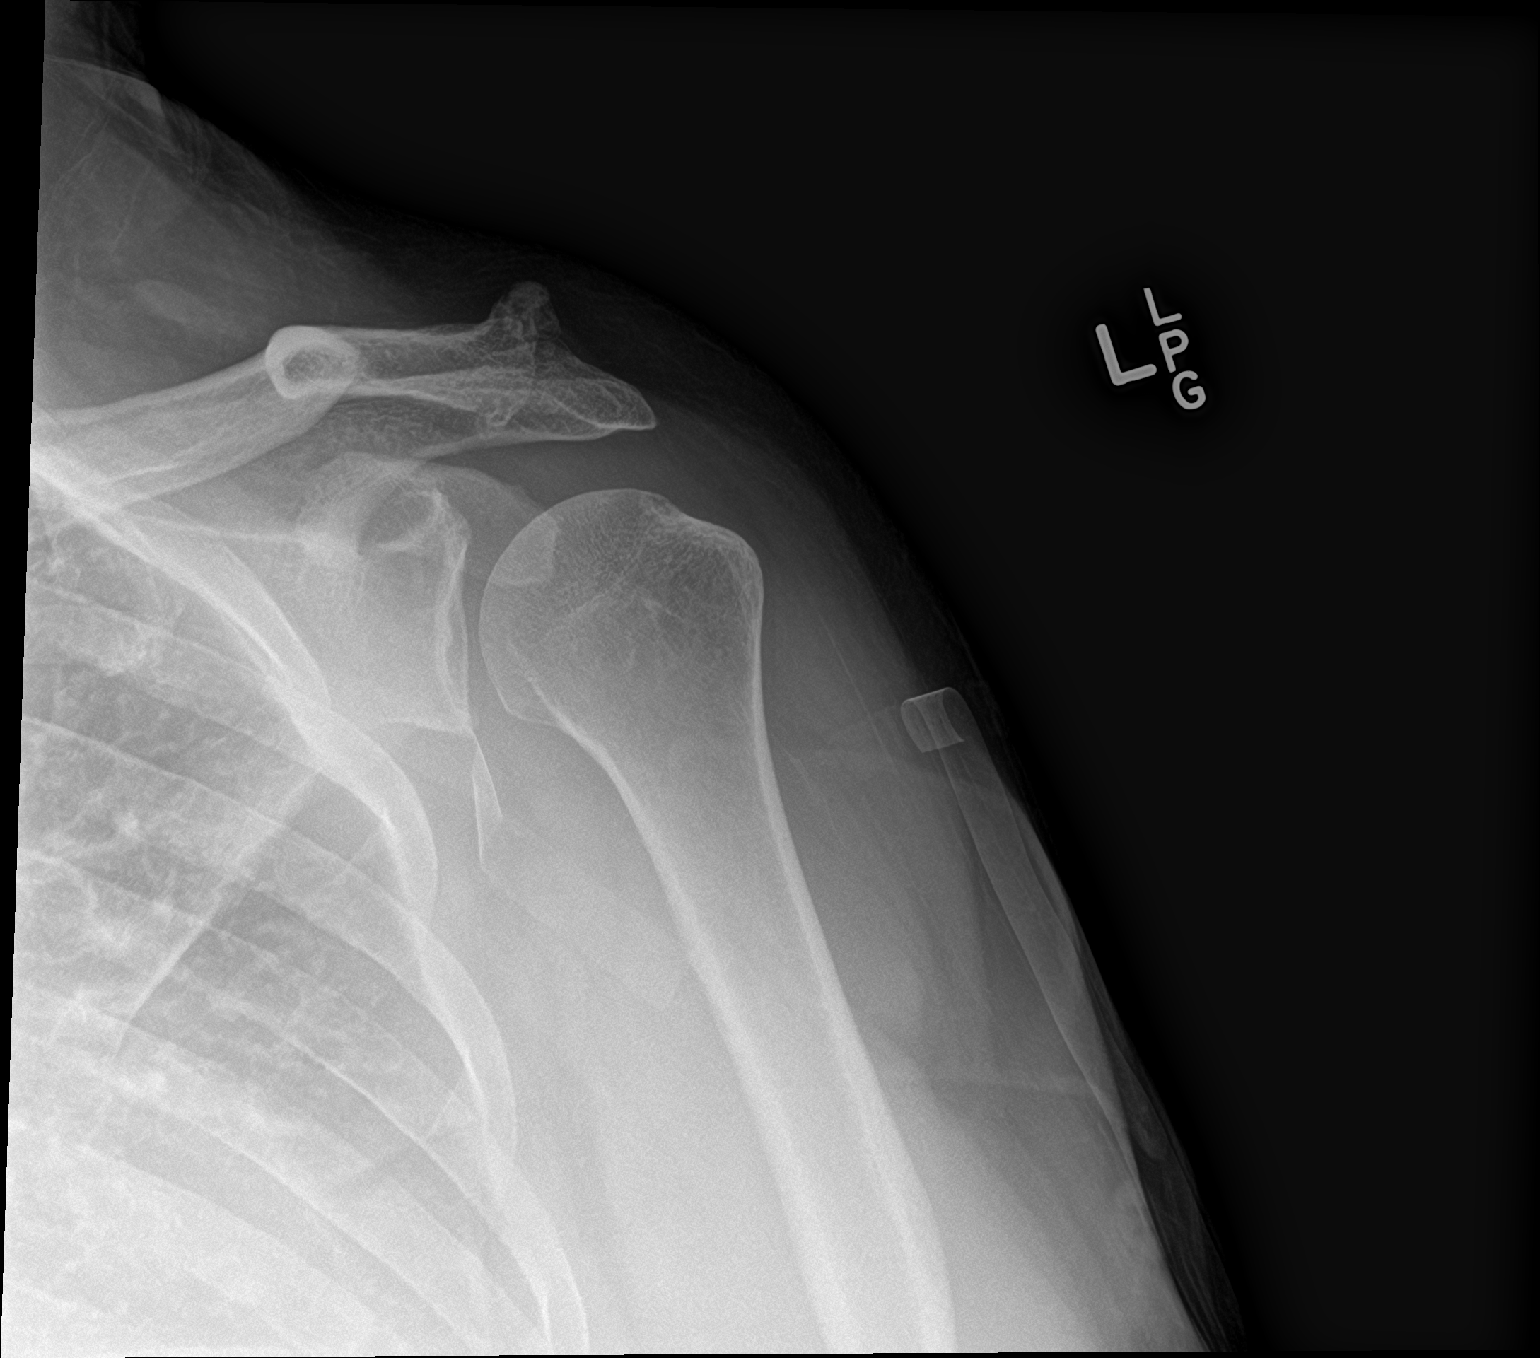

[shoulder y-view]
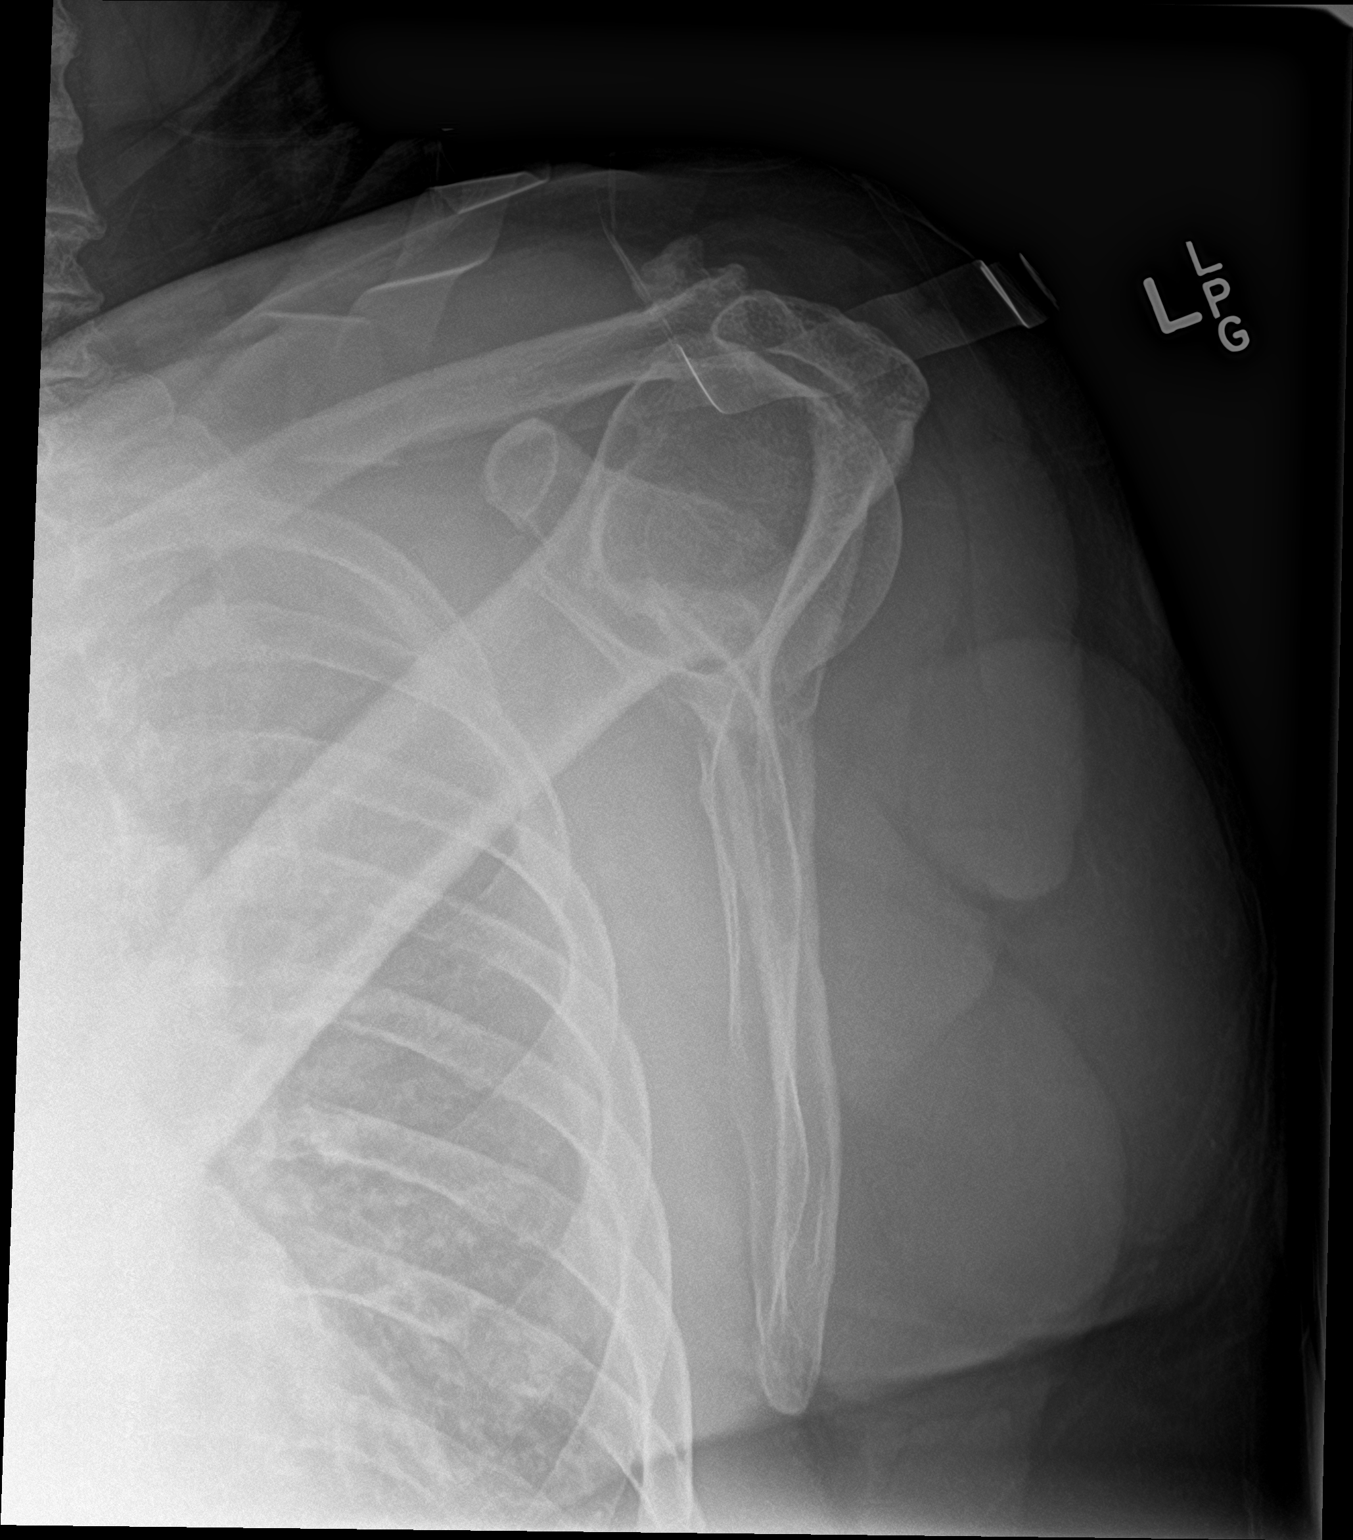

[shoulder axial]
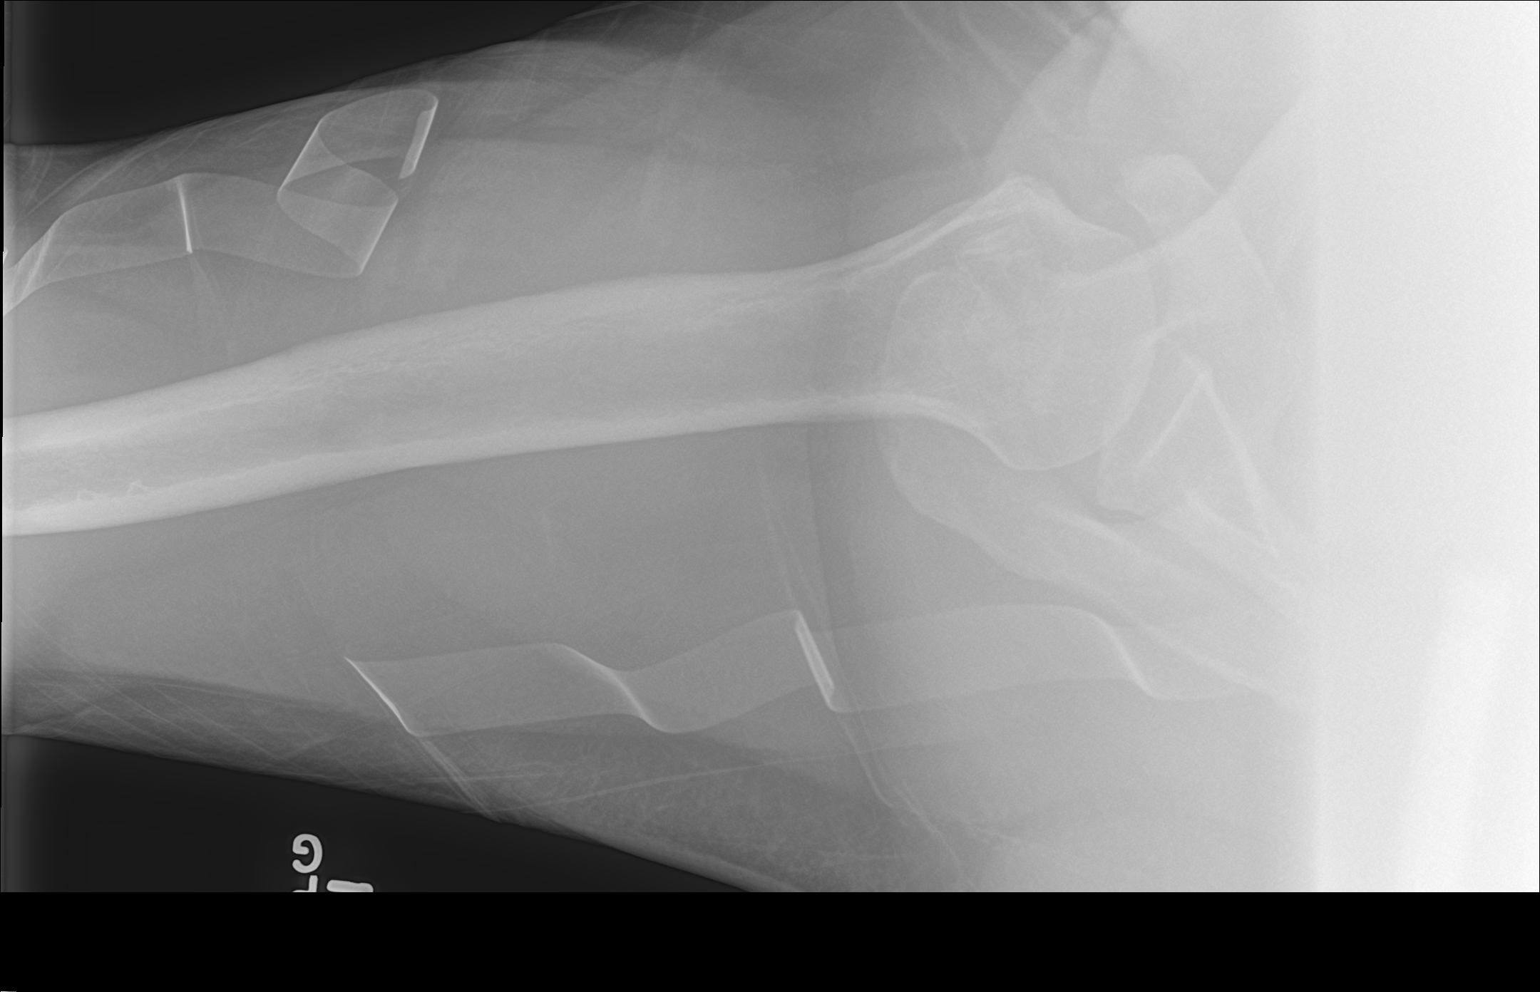

[3 of 3 positions shown; findings below may reference images not displayed]

FINDINGS: There is no evidence of fracture or dislocation. Moderate
degenerative changes seen involving the left acromioclavicular
joint. Soft tissues are unremarkable.
IMPRESSION: Moderate degenerative joint disease of the left acromioclavicular
joint. No acute abnormality seen.

## 2024-01-11 ENCOUNTER — Other Ambulatory Visit: Payer: Self-pay | Admitting: Family

## 2024-02-09 ENCOUNTER — Other Ambulatory Visit: Payer: Self-pay | Admitting: Nurse Practitioner

## 2024-02-09 DIAGNOSIS — I10 Essential (primary) hypertension: Secondary | ICD-10-CM

## 2024-05-05 ENCOUNTER — Ambulatory Visit: Admitting: Nurse Practitioner

## 2024-05-16 ENCOUNTER — Ambulatory Visit: Admitting: Adult Health
